# Patient Record
Sex: Female | Born: 1950
Health system: Southern US, Community
[De-identification: ages and names within clinical notes are randomized; demographics above are authoritative.]

## PROBLEM LIST (undated history)

## (undated) DIAGNOSIS — J449 Chronic obstructive pulmonary disease, unspecified: Secondary | ICD-10-CM

## (undated) DIAGNOSIS — I10 Essential (primary) hypertension: Secondary | ICD-10-CM

## (undated) DIAGNOSIS — M1990 Unspecified osteoarthritis, unspecified site: Secondary | ICD-10-CM

## (undated) DIAGNOSIS — I82409 Acute embolism and thrombosis of unspecified deep veins of unspecified lower extremity: Secondary | ICD-10-CM

## (undated) DIAGNOSIS — N189 Chronic kidney disease, unspecified: Secondary | ICD-10-CM

## (undated) DIAGNOSIS — D649 Anemia, unspecified: Secondary | ICD-10-CM

## (undated) DIAGNOSIS — K5090 Crohn's disease, unspecified, without complications: Secondary | ICD-10-CM

---

## 2003-11-25 DIAGNOSIS — I82409 Acute embolism and thrombosis of unspecified deep veins of unspecified lower extremity: Secondary | ICD-10-CM

## 2003-11-25 HISTORY — DX: Acute embolism and thrombosis of unspecified deep veins of unspecified lower extremity: I82.409

## 2003-12-14 ENCOUNTER — Other Ambulatory Visit: Payer: Self-pay

## 2004-06-21 ENCOUNTER — Other Ambulatory Visit: Payer: Self-pay

## 2005-04-17 ENCOUNTER — Ambulatory Visit: Payer: Self-pay | Admitting: Internal Medicine

## 2006-08-13 ENCOUNTER — Ambulatory Visit: Payer: Self-pay | Admitting: Internal Medicine

## 2006-10-30 ENCOUNTER — Ambulatory Visit: Payer: Self-pay | Admitting: Unknown Physician Specialty

## 2006-12-24 ENCOUNTER — Ambulatory Visit: Payer: Self-pay | Admitting: Internal Medicine

## 2006-12-25 ENCOUNTER — Ambulatory Visit: Payer: Self-pay | Admitting: Internal Medicine

## 2007-01-01 ENCOUNTER — Ambulatory Visit: Payer: Self-pay | Admitting: Unknown Physician Specialty

## 2007-01-23 ENCOUNTER — Ambulatory Visit: Payer: Self-pay | Admitting: Internal Medicine

## 2007-01-29 ENCOUNTER — Ambulatory Visit: Payer: Self-pay | Admitting: Unknown Physician Specialty

## 2007-02-23 ENCOUNTER — Ambulatory Visit: Payer: Self-pay | Admitting: Internal Medicine

## 2007-10-12 ENCOUNTER — Ambulatory Visit: Payer: Self-pay | Admitting: Internal Medicine

## 2008-01-07 ENCOUNTER — Ambulatory Visit: Payer: Self-pay | Admitting: Specialist

## 2008-02-08 ENCOUNTER — Other Ambulatory Visit: Payer: Self-pay

## 2008-02-08 ENCOUNTER — Ambulatory Visit: Payer: Self-pay | Admitting: Specialist

## 2008-02-17 ENCOUNTER — Ambulatory Visit: Payer: Self-pay | Admitting: Specialist

## 2008-03-13 ENCOUNTER — Ambulatory Visit: Payer: Self-pay | Admitting: Specialist

## 2008-07-25 ENCOUNTER — Inpatient Hospital Stay: Payer: Self-pay | Admitting: Unknown Physician Specialty

## 2008-10-13 ENCOUNTER — Ambulatory Visit: Payer: Self-pay | Admitting: Internal Medicine

## 2008-11-09 ENCOUNTER — Ambulatory Visit: Payer: Self-pay

## 2008-12-19 ENCOUNTER — Ambulatory Visit: Payer: Self-pay | Admitting: Internal Medicine

## 2009-05-25 ENCOUNTER — Ambulatory Visit: Payer: Self-pay | Admitting: Obstetrics and Gynecology

## 2009-06-01 ENCOUNTER — Ambulatory Visit: Payer: Self-pay | Admitting: Obstetrics and Gynecology

## 2009-10-22 ENCOUNTER — Ambulatory Visit: Payer: Self-pay | Admitting: Internal Medicine

## 2010-10-23 ENCOUNTER — Ambulatory Visit: Payer: Self-pay | Admitting: Internal Medicine

## 2010-12-04 ENCOUNTER — Other Ambulatory Visit: Payer: Self-pay

## 2011-12-09 ENCOUNTER — Ambulatory Visit: Payer: Self-pay | Admitting: Internal Medicine

## 2011-12-19 ENCOUNTER — Ambulatory Visit: Payer: Self-pay | Admitting: Unknown Physician Specialty

## 2011-12-22 LAB — PATHOLOGY REPORT

## 2015-03-12 ENCOUNTER — Ambulatory Visit
Admit: 2015-03-12 | Disposition: A | Discharge status: Home / Self Care | Payer: Self-pay | Attending: Family Medicine | Admitting: Family Medicine

## 2015-03-21 ENCOUNTER — Other Ambulatory Visit: Payer: Self-pay | Admitting: Family Medicine

## 2015-03-21 DIAGNOSIS — Z1231 Encounter for screening mammogram for malignant neoplasm of breast: Secondary | ICD-10-CM

## 2015-05-30 ENCOUNTER — Encounter: Payer: Self-pay | Admitting: *Deleted

## 2015-05-31 ENCOUNTER — Ambulatory Visit
Admission: RE | Admit: 2015-05-31 | Discharge: 2015-05-31 | Disposition: A | Discharge status: Home / Self Care | Payer: Medicare Other | Source: Ambulatory Visit | Attending: Unknown Physician Specialty | Admitting: Unknown Physician Specialty

## 2015-05-31 ENCOUNTER — Encounter: Payer: Self-pay | Admitting: Anesthesiology

## 2015-05-31 ENCOUNTER — Encounter
Admission: RE | Disposition: A | Discharge status: Home / Self Care | Payer: Self-pay | Source: Ambulatory Visit | Attending: Unknown Physician Specialty

## 2015-05-31 ENCOUNTER — Ambulatory Visit: Payer: Medicare Other | Admitting: Anesthesiology

## 2015-05-31 DIAGNOSIS — N189 Chronic kidney disease, unspecified: Secondary | ICD-10-CM | POA: Diagnosis not present

## 2015-05-31 DIAGNOSIS — I252 Old myocardial infarction: Secondary | ICD-10-CM | POA: Diagnosis not present

## 2015-05-31 DIAGNOSIS — J449 Chronic obstructive pulmonary disease, unspecified: Secondary | ICD-10-CM | POA: Diagnosis not present

## 2015-05-31 DIAGNOSIS — K5000 Crohn's disease of small intestine without complications: Secondary | ICD-10-CM | POA: Insufficient documentation

## 2015-05-31 DIAGNOSIS — I82409 Acute embolism and thrombosis of unspecified deep veins of unspecified lower extremity: Secondary | ICD-10-CM | POA: Diagnosis not present

## 2015-05-31 DIAGNOSIS — Z79899 Other long term (current) drug therapy: Secondary | ICD-10-CM | POA: Diagnosis not present

## 2015-05-31 DIAGNOSIS — K529 Noninfective gastroenteritis and colitis, unspecified: Secondary | ICD-10-CM | POA: Diagnosis not present

## 2015-05-31 DIAGNOSIS — I129 Hypertensive chronic kidney disease with stage 1 through stage 4 chronic kidney disease, or unspecified chronic kidney disease: Secondary | ICD-10-CM | POA: Diagnosis not present

## 2015-05-31 HISTORY — PX: COLONOSCOPY: SHX5424

## 2015-05-31 HISTORY — DX: Essential (primary) hypertension: I10

## 2015-05-31 HISTORY — DX: Anemia, unspecified: D64.9

## 2015-05-31 HISTORY — DX: Acute embolism and thrombosis of unspecified deep veins of unspecified lower extremity: I82.409

## 2015-05-31 HISTORY — DX: Chronic kidney disease, unspecified: N18.9

## 2015-05-31 HISTORY — DX: Unspecified osteoarthritis, unspecified site: M19.90

## 2015-05-31 HISTORY — DX: Crohn's disease, unspecified, without complications: K50.90

## 2015-05-31 HISTORY — DX: Chronic obstructive pulmonary disease, unspecified: J44.9

## 2015-05-31 SURGERY — COLONOSCOPY
Anesthesia: General

## 2015-05-31 MED ORDER — PROPOFOL INFUSION 10 MG/ML OPTIME
INTRAVENOUS | Status: DC | PRN
  Administered 2015-05-31: 120 ug/kg/min via INTRAVENOUS

## 2015-05-31 MED ORDER — MIDAZOLAM HCL 5 MG/5ML IJ SOLN
INTRAMUSCULAR | Status: DC | PRN
Start: 2015-05-31 — End: 2015-05-31
  Administered 2015-05-31: 1 mg via INTRAVENOUS

## 2015-05-31 MED ORDER — FENTANYL CITRATE (PF) 100 MCG/2ML IJ SOLN
INTRAMUSCULAR | Status: DC | PRN
  Administered 2015-05-31: 50 ug via INTRAVENOUS

## 2015-05-31 MED ORDER — SODIUM CHLORIDE 0.9 % IV SOLN
INTRAVENOUS | Status: DC
  Administered 2015-05-31: 13:00:00 via INTRAVENOUS

## 2015-05-31 MED ORDER — PROPOFOL 10 MG/ML IV BOLUS
INTRAVENOUS | Status: DC | PRN
  Administered 2015-05-31: 30 mg via INTRAVENOUS

## 2015-05-31 MED ORDER — PHENYLEPHRINE HCL 10 MG/ML IJ SOLN
INTRAMUSCULAR | Status: DC | PRN
  Administered 2015-05-31 (×3): 100 ug via INTRAVENOUS

## 2015-05-31 MED ORDER — SODIUM CHLORIDE 0.9 % IV SOLN
INTRAVENOUS | Status: DC
Start: 2015-05-31 — End: 2015-05-31

## 2015-05-31 NOTE — H&P (Signed)
Primary Care Physician:  Loistine Chance, MD Primary Gastroenterologist:  Dr. Vira Agar  Pre-Procedure History & Physical: HPI:  Bridget Huber is a 64 y.o. female is here for an colonoscopy.   Past Medical History  Diagnosis Date  . Hypertension   . Crohn's disease   . Anemia   . DVT (deep venous thrombosis)   . Chronic kidney disease     kidney stones  . COPD (chronic obstructive pulmonary disease)   . Arthritis     History reviewed. No pertinent past surgical history.  Prior to Admission medications   Medication Sig Start Date End Date Taking? Authorizing Provider  acetaminophen (TYLENOL) 650 MG CR tablet Take 650 mg by mouth every 8 (eight) hours as needed for pain.   Yes Historical Provider, MD  azaTHIOprine (IMURAN) 50 MG tablet Take 50 mg by mouth daily.   Yes Historical Provider, MD  Calcium Carbonate-Vitamin D 600-400 MG-UNIT per tablet Take 1 tablet by mouth daily.   Yes Historical Provider, MD  Multiple Vitamins-Minerals (MULTIVITAMIN WITH MINERALS) tablet Take 1 tablet by mouth daily.   Yes Historical Provider, MD    Allergies as of 03/05/2015  . (Not on File)    History reviewed. No pertinent family history.  History   Social History  . Marital Status: Divorced    Spouse Name: N/A  . Number of Children: N/A  . Years of Education: N/A   Occupational History  . Not on file.   Social History Main Topics  . Smoking status: Not on file  . Smokeless tobacco: Not on file  . Alcohol Use: Not on file  . Drug Use: Not on file  . Sexual Activity: Not on file   Other Topics Concern  . Not on file   Social History Narrative    Review of Systems: See HPI, otherwise negative ROS  Physical Exam: BP 103/77 mmHg  Pulse 94  Temp(Src) 98.8 F (37.1 C) (Tympanic)  Resp 20  Ht 5\' 2"  (1.575 m)  Wt 53.524 kg (118 lb)  BMI 21.58 kg/m2  SpO2 100% General:   Alert,  pleasant and cooperative in NAD Head:  Normocephalic and atraumatic. Neck:  Supple; no  masses or thyromegaly. Lungs:  Clear throughout to auscultation.    Heart:  Regular rate and rhythm. Abdomen:  Soft, nontender and nondistended. Normal bowel sounds, without guarding, and without rebound.   Neurologic:  Alert and  oriented x4;  grossly normal neurologically.  Impression/Plan: [name] is here for an colonoscopy to be performed for Crohn's disease  Risks, benefits, limitations, and alternatives regarding  colonoscopy have been reviewed with the patient.  Questions have been answered.  All parties agreeable.   Gaylyn Cheers, MD  05/31/2015, 1:27 PM   Primary Care Physician:  Loistine Chance, MD Primary Gastroenterologist:  Dr. Vira Agar  Pre-Procedure History & Physical: HPI:  Bridget Huber is a 64 y.o. female is here for an colonoscopy.   Past Medical History  Diagnosis Date  . Hypertension   . Crohn's disease   . Anemia   . DVT (deep venous thrombosis)   . Chronic kidney disease     kidney stones  . COPD (chronic obstructive pulmonary disease)   . Arthritis     History reviewed. No pertinent past surgical history.  Prior to Admission medications   Medication Sig Start Date End Date Taking? Authorizing Provider  acetaminophen (TYLENOL) 650 MG CR tablet Take 650 mg by mouth every 8 (eight) hours  as needed for pain.   Yes Historical Provider, MD  azaTHIOprine (IMURAN) 50 MG tablet Take 50 mg by mouth daily.   Yes Historical Provider, MD  Calcium Carbonate-Vitamin D 600-400 MG-UNIT per tablet Take 1 tablet by mouth daily.   Yes Historical Provider, MD  Multiple Vitamins-Minerals (MULTIVITAMIN WITH MINERALS) tablet Take 1 tablet by mouth daily.   Yes Historical Provider, MD    Allergies as of 03/05/2015  . (Not on File)    History reviewed. No pertinent family history.  History   Social History  . Marital Status: Divorced    Spouse Name: N/A  . Number of Children: N/A  . Years of Education: N/A   Occupational History  . Not on file.    Social History Main Topics  . Smoking status: Not on file  . Smokeless tobacco: Not on file  . Alcohol Use: Not on file  . Drug Use: Not on file  . Sexual Activity: Not on file   Other Topics Concern  . Not on file   Social History Narrative    Review of Systems: See HPI, otherwise negative ROS  Physical Exam: BP 103/77 mmHg  Pulse 94  Temp(Src) 98.8 F (37.1 C) (Tympanic)  Resp 20  Ht 5\' 2"  (1.575 m)  Wt 53.524 kg (118 lb)  BMI 21.58 kg/m2  SpO2 100% General:   Alert,  pleasant and cooperative in NAD Head:  Normocephalic and atraumatic. Neck:  Supple; no masses or thyromegaly. Lungs:  Clear throughout to auscultation.    Heart:  Regular rate and rhythm. Abdomen:  Soft, nontender and nondistended. Normal bowel sounds, without guarding, and without rebound.   Neurologic:  Alert and  oriented x4;  grossly normal neurologically.  Impression/Plan: Bridget Huber is here for an colonoscopy to be performed for Crohn's disease  Risks, benefits, limitations, and alternatives regarding  colonoscopy have been reviewed with the patient.  Questions have been answered.  All parties agreeable.   Gaylyn Cheers, MD  05/31/2015, 1:27 PM

## 2015-05-31 NOTE — Op Note (Signed)
[facility] Patient Name: Bridget Huber Procedure Date: 05/31/2015 1:35 PM MRN: [id number] Account #: [id number] Date of Birth: 1951/10/27 Admit Type: Outpatient Age: 64 Room: [facility] ENDO ROOM 1 Gender: Female Note Status: Finalized Procedure:         Colonoscopy Indications:       Follow-up of Crohn's disease of the small bowel and colon Providers:         Manya Silvas, MD Referring MD:      Tania Ade (Referring MD) Medicines:         Propofol per Anesthesia Complications:     No immediate complications. Procedure:         Pre-Anesthesia Assessment:                    - After reviewing the risks and benefits, the patient was                     deemed in satisfactory condition to undergo the procedure.                    After obtaining informed consent, the colonoscope was                     passed under direct vision. Throughout the procedure, the                     patient's blood pressure, pulse, and oxygen saturations                     were monitored continuously. The Colonoscope was                     introduced through the anus and advanced to the the                     terminal ileum. The colonoscopy was performed without                     difficulty. The patient tolerated the procedure well. The                     quality of the bowel preparation was excellent. Findings:      The colon (entire examined portion) appeared normal.      A diffuse and patchy area of mucosa in the distal ileum was moderately       erythematous, granular and ulcerated. Biopsies were taken with a cold       forceps for histology. Impression:        - The entire examined colon is normal.                    - Erythematous, granular and ulcerated mucosa in the                     distal ileum. Biopsied. Recommendation:    - Await pathology results. Discuss medication change. Manya Silvas, MD 05/31/2015 1:54:32 PM This report has been signed  electronically. Number of Addenda: 0 Note Initiated On: 05/31/2015 1:35 PM Scope Withdrawal Time: 0 hours 4 minutes 7 seconds  Total Procedure Duration: 0 hours 10 minutes 12 seconds       [facility]

## 2015-05-31 NOTE — Transfer of Care (Signed)
Immediate Anesthesia Transfer of Care Note  Patient: Bridget Huber  Procedure(s) Performed: Procedure(s): COLONOSCOPY (N/A)  Patient Location: PACU  Anesthesia Type:General  Level of Consciousness: awake, alert , oriented and patient cooperative  Airway & Oxygen Therapy: Patient Spontanous Breathing and Patient connected to nasal cannula oxygen  Post-op Assessment: Report given to RN and Post -op Vital signs reviewed and stable  Post vital signs: Reviewed and stable  Last Vitals:  Filed Vitals:   05/31/15 1358  BP: 83/50  Pulse:   Temp: 36 C  Resp: 16    Complications: No apparent anesthesia complications

## 2015-05-31 NOTE — Anesthesia Postprocedure Evaluation (Signed)
  Anesthesia Post-op Note  Patient: Bridget Huber  Procedure(s) Performed: Procedure(s): COLONOSCOPY (N/A)  Anesthesia type:General  Patient location: PACU  Post pain: Pain level controlled  Post assessment: Post-op Vital signs reviewed, Patient's Cardiovascular Status Stable, Respiratory Function Stable, Patent Airway and No signs of Nausea or vomiting  Post vital signs: Reviewed and stable  Last Vitals:  Filed Vitals:   05/31/15 1415  BP: 101/59  Pulse: 75  Temp:   Resp: 19    Level of consciousness: awake, alert  and patient cooperative  Complications: No apparent anesthesia complications

## 2015-05-31 NOTE — Anesthesia Preprocedure Evaluation (Signed)
Anesthesia Evaluation  Patient identified by MRN, date of birth, ID band Patient awake    Reviewed: Allergy & Precautions, H&P , NPO status , Patient's Chart, lab work & pertinent test results, reviewed documented beta blocker date and time   Airway Mallampati: II  TM Distance: >3 FB Neck ROM: full    Dental  (+) Lower Dentures, Upper Dentures   Pulmonary shortness of breath, COPD breath sounds clear to auscultation  Pulmonary exam normal       Cardiovascular hypertension, - Past MI negative cardio ROS Normal cardiovascular examRhythm:regular Rate:Normal     Neuro/Psych negative neurological ROS  negative psych ROS   GI/Hepatic negative GI ROS, Neg liver ROS,   Endo/Other  negative endocrine ROS  Renal/GU CRFRenal disease  negative genitourinary   Musculoskeletal   Abdominal   Peds  Hematology negative hematology ROS (+)   Anesthesia Other Findings Past Medical History:   Hypertension                                                 Crohn's disease                                              Anemia                                                       DVT (deep venous thrombosis)                                 Chronic kidney disease                                         Comment:kidney stones   COPD (chronic obstructive pulmonary disease)                 Arthritis                                                    Reproductive/Obstetrics negative OB ROS                             Anesthesia Physical Anesthesia Plan  ASA: III  Anesthesia Plan: General   Post-op Pain Management:    Induction:   Airway Management Planned:   Additional Equipment:   Intra-op Plan:   Post-operative Plan:   Informed Consent: I have reviewed the patients History and Physical, chart, labs and discussed the procedure including the risks, benefits and alternatives for the proposed anesthesia with  the patient or authorized representative who has indicated his/her understanding and acceptance.   Dental Advisory Given  Plan Discussed with: Anesthesiologist, CRNA and Surgeon  Anesthesia Plan Comments:  Anesthesia Quick Evaluation  

## 2015-06-01 ENCOUNTER — Encounter: Payer: Self-pay | Admitting: Unknown Physician Specialty

## 2015-06-04 LAB — SURGICAL PATHOLOGY

## 2017-08-31 ENCOUNTER — Other Ambulatory Visit: Payer: Self-pay | Admitting: Unknown Physician Specialty

## 2017-08-31 ENCOUNTER — Other Ambulatory Visit: Payer: Self-pay | Admitting: Family Medicine

## 2017-08-31 DIAGNOSIS — Z1231 Encounter for screening mammogram for malignant neoplasm of breast: Secondary | ICD-10-CM

## 2017-09-17 ENCOUNTER — Ambulatory Visit
Admission: RE | Admit: 2017-09-17 | Discharge: 2017-09-17 | Disposition: A | Discharge status: Home / Self Care | Payer: No Typology Code available for payment source | Source: Ambulatory Visit | Attending: Family Medicine | Admitting: Family Medicine

## 2017-09-17 ENCOUNTER — Encounter: Payer: Self-pay | Admitting: Radiology

## 2017-09-17 DIAGNOSIS — Z1231 Encounter for screening mammogram for malignant neoplasm of breast: Secondary | ICD-10-CM | POA: Insufficient documentation

## 2019-09-29 ENCOUNTER — Other Ambulatory Visit: Payer: Self-pay | Admitting: Family Medicine

## 2019-09-29 DIAGNOSIS — Z1231 Encounter for screening mammogram for malignant neoplasm of breast: Secondary | ICD-10-CM

## 2019-12-27 ENCOUNTER — Ambulatory Visit
Admission: RE | Admit: 2019-12-27 | Discharge: 2019-12-27 | Disposition: A | Discharge status: Home / Self Care | Payer: No Typology Code available for payment source | Source: Ambulatory Visit | Attending: Family Medicine | Admitting: Family Medicine

## 2019-12-27 DIAGNOSIS — Z1231 Encounter for screening mammogram for malignant neoplasm of breast: Secondary | ICD-10-CM | POA: Diagnosis present

## 2020-01-07 ENCOUNTER — Ambulatory Visit: Payer: PRIVATE HEALTH INSURANCE | Attending: Internal Medicine

## 2020-01-07 DIAGNOSIS — Z23 Encounter for immunization: Secondary | ICD-10-CM | POA: Insufficient documentation

## 2020-01-07 NOTE — Progress Notes (Signed)
   Covid-19 Vaccination Clinic  Name:  Bridget Huber    MRN: [id number] DOB: 01/23/1951  01/07/2020  [name] was observed post Covid-19 immunization for 15 minutes without incidence. She was provided with Vaccine Information Sheet and instruction to access the V-Safe system.   [name] was instructed to call 911 with any severe reactions post vaccine: Marland Kitchen Difficulty breathing  . Swelling of your face and throat  . A fast heartbeat  . A bad rash all over your body  . Dizziness and weakness    Immunizations Administered    Name Date Dose VIS Date Route   Pfizer COVID-19 Vaccine 01/07/2020  9:47 AM 0.3 mL 11/04/2019 Intramuscular   Manufacturer: [place]   Lot: [id number]   [place]: [id number]

## 2020-01-28 ENCOUNTER — Ambulatory Visit: Payer: PRIVATE HEALTH INSURANCE | Attending: Internal Medicine

## 2020-01-28 ENCOUNTER — Other Ambulatory Visit: Payer: Self-pay

## 2020-01-28 DIAGNOSIS — Z23 Encounter for immunization: Secondary | ICD-10-CM | POA: Insufficient documentation

## 2020-01-28 NOTE — Progress Notes (Signed)
   Covid-19 Vaccination Clinic  Name:  JA CARPENTIER    MRN: [id number] DOB: 1951/05/10  01/28/2020  Ms. Anschutz was observed post Covid-19 immunization for 15 minutes without incident. She was provided with Vaccine Information Sheet and instruction to access the V-Safe system.   Ms. Durrer was instructed to call 911 with any severe reactions post vaccine: Marland Kitchen Difficulty breathing  . Swelling of face and throat  . A fast heartbeat  . A bad rash all over body  . Dizziness and weakness   Immunizations Administered    Name Date Dose VIS Date Route   Pfizer COVID-19 Vaccine 01/28/2020  9:18 AM 0.3 mL 11/04/2019 Intramuscular   Manufacturer: [place]   Lot: [id number]   [place]: [id number]

## 2020-09-04 DIAGNOSIS — Z23 Encounter for immunization: Secondary | ICD-10-CM | POA: Diagnosis not present

## 2020-11-27 DIAGNOSIS — N183 Chronic kidney disease, stage 3 unspecified: Secondary | ICD-10-CM | POA: Diagnosis present

## 2020-11-27 DIAGNOSIS — N1831 Chronic kidney disease, stage 3a: Secondary | ICD-10-CM | POA: Diagnosis not present

## 2020-11-27 DIAGNOSIS — K5010 Crohn's disease of large intestine without complications: Secondary | ICD-10-CM | POA: Diagnosis not present

## 2020-11-27 DIAGNOSIS — Z0000 Encounter for general adult medical examination without abnormal findings: Secondary | ICD-10-CM | POA: Diagnosis not present

## 2020-11-29 DIAGNOSIS — K5000 Crohn's disease of small intestine without complications: Secondary | ICD-10-CM | POA: Diagnosis not present

## 2021-01-01 DIAGNOSIS — M8588 Other specified disorders of bone density and structure, other site: Secondary | ICD-10-CM | POA: Diagnosis not present

## 2021-02-28 DIAGNOSIS — Z79899 Other long term (current) drug therapy: Secondary | ICD-10-CM | POA: Diagnosis not present

## 2021-02-28 DIAGNOSIS — K5000 Crohn's disease of small intestine without complications: Secondary | ICD-10-CM | POA: Diagnosis not present

## 2021-03-06 DIAGNOSIS — K5000 Crohn's disease of small intestine without complications: Secondary | ICD-10-CM | POA: Diagnosis not present

## 2021-03-19 ENCOUNTER — Other Ambulatory Visit: Payer: Self-pay

## 2021-03-19 ENCOUNTER — Emergency Department: Payer: PPO

## 2021-03-19 ENCOUNTER — Inpatient Hospital Stay
Admission: EM | Admit: 2021-03-19 | Discharge: 2021-03-22 | DRG: 386 | Disposition: A | Discharge status: Home / Self Care | Payer: PPO | Attending: Internal Medicine | Admitting: Internal Medicine

## 2021-03-19 DIAGNOSIS — E876 Hypokalemia: Secondary | ICD-10-CM | POA: Diagnosis present

## 2021-03-19 DIAGNOSIS — K6389 Other specified diseases of intestine: Secondary | ICD-10-CM | POA: Diagnosis not present

## 2021-03-19 DIAGNOSIS — K644 Residual hemorrhoidal skin tags: Secondary | ICD-10-CM | POA: Diagnosis not present

## 2021-03-19 DIAGNOSIS — N179 Acute kidney failure, unspecified: Secondary | ICD-10-CM | POA: Diagnosis present

## 2021-03-19 DIAGNOSIS — Z8719 Personal history of other diseases of the digestive system: Secondary | ICD-10-CM

## 2021-03-19 DIAGNOSIS — E860 Dehydration: Secondary | ICD-10-CM | POA: Diagnosis not present

## 2021-03-19 DIAGNOSIS — N1831 Chronic kidney disease, stage 3a: Secondary | ICD-10-CM | POA: Diagnosis present

## 2021-03-19 DIAGNOSIS — Z87442 Personal history of urinary calculi: Secondary | ICD-10-CM | POA: Diagnosis not present

## 2021-03-19 DIAGNOSIS — Z1211 Encounter for screening for malignant neoplasm of colon: Secondary | ICD-10-CM | POA: Diagnosis not present

## 2021-03-19 DIAGNOSIS — F17200 Nicotine dependence, unspecified, uncomplicated: Secondary | ICD-10-CM | POA: Diagnosis present

## 2021-03-19 DIAGNOSIS — Z720 Tobacco use: Secondary | ICD-10-CM | POA: Diagnosis present

## 2021-03-19 DIAGNOSIS — I129 Hypertensive chronic kidney disease with stage 1 through stage 4 chronic kidney disease, or unspecified chronic kidney disease: Secondary | ICD-10-CM | POA: Diagnosis present

## 2021-03-19 DIAGNOSIS — R112 Nausea with vomiting, unspecified: Secondary | ICD-10-CM | POA: Diagnosis not present

## 2021-03-19 DIAGNOSIS — Z20822 Contact with and (suspected) exposure to covid-19: Secondary | ICD-10-CM | POA: Diagnosis present

## 2021-03-19 DIAGNOSIS — R1084 Generalized abdominal pain: Secondary | ICD-10-CM

## 2021-03-19 DIAGNOSIS — K838 Other specified diseases of biliary tract: Secondary | ICD-10-CM | POA: Diagnosis not present

## 2021-03-19 DIAGNOSIS — K633 Ulcer of intestine: Secondary | ICD-10-CM | POA: Diagnosis not present

## 2021-03-19 DIAGNOSIS — J449 Chronic obstructive pulmonary disease, unspecified: Secondary | ICD-10-CM | POA: Diagnosis not present

## 2021-03-19 DIAGNOSIS — D509 Iron deficiency anemia, unspecified: Secondary | ICD-10-CM | POA: Diagnosis not present

## 2021-03-19 DIAGNOSIS — K3189 Other diseases of stomach and duodenum: Secondary | ICD-10-CM | POA: Diagnosis not present

## 2021-03-19 DIAGNOSIS — K5090 Crohn's disease, unspecified, without complications: Secondary | ICD-10-CM | POA: Diagnosis not present

## 2021-03-19 DIAGNOSIS — I10 Essential (primary) hypertension: Secondary | ICD-10-CM | POA: Diagnosis present

## 2021-03-19 DIAGNOSIS — Z8249 Family history of ischemic heart disease and other diseases of the circulatory system: Secondary | ICD-10-CM

## 2021-03-19 DIAGNOSIS — K5939 Other megacolon: Secondary | ICD-10-CM | POA: Diagnosis not present

## 2021-03-19 DIAGNOSIS — K5000 Crohn's disease of small intestine without complications: Secondary | ICD-10-CM | POA: Diagnosis not present

## 2021-03-19 DIAGNOSIS — K449 Diaphragmatic hernia without obstruction or gangrene: Secondary | ICD-10-CM | POA: Diagnosis not present

## 2021-03-19 DIAGNOSIS — K50912 Crohn's disease, unspecified, with intestinal obstruction: Secondary | ICD-10-CM | POA: Diagnosis present

## 2021-03-19 DIAGNOSIS — Z79899 Other long term (current) drug therapy: Secondary | ICD-10-CM

## 2021-03-19 DIAGNOSIS — E872 Acidosis: Secondary | ICD-10-CM | POA: Diagnosis not present

## 2021-03-19 DIAGNOSIS — Z9049 Acquired absence of other specified parts of digestive tract: Secondary | ICD-10-CM

## 2021-03-19 DIAGNOSIS — R1032 Left lower quadrant pain: Secondary | ICD-10-CM | POA: Diagnosis present

## 2021-03-19 DIAGNOSIS — Z886 Allergy status to analgesic agent status: Secondary | ICD-10-CM

## 2021-03-19 DIAGNOSIS — K50012 Crohn's disease of small intestine with intestinal obstruction: Principal | ICD-10-CM | POA: Diagnosis present

## 2021-03-19 DIAGNOSIS — R109 Unspecified abdominal pain: Secondary | ICD-10-CM | POA: Insufficient documentation

## 2021-03-19 LAB — COMPREHENSIVE METABOLIC PANEL
ALT: 12 U/L (ref 0–44)
AST: 15 U/L (ref 15–41)
Albumin: 3.8 g/dL (ref 3.5–5.0)
Alkaline Phosphatase: 83 U/L (ref 38–126)
Anion gap: 11 (ref 5–15)
BUN: 22 mg/dL (ref 8–23)
CO2: 17 mmol/L — ABNORMAL LOW (ref 22–32)
Calcium: 8.9 mg/dL (ref 8.9–10.3)
Chloride: 108 mmol/L (ref 98–111)
Creatinine, Ser: 1.62 mg/dL — ABNORMAL HIGH (ref 0.44–1.00)
GFR, Estimated: 34 mL/min — ABNORMAL LOW (ref >60)
Glucose, Bld: 117 mg/dL — ABNORMAL HIGH (ref 70–99)
Potassium: 3 mmol/L — ABNORMAL LOW (ref 3.5–5.1)
Sodium: 136 mmol/L (ref 135–145)
Total Bilirubin: 0.8 mg/dL (ref 0.3–1.2)
Total Protein: 7 g/dL (ref 6.5–8.1)

## 2021-03-19 LAB — URINALYSIS, COMPLETE (UACMP) WITH MICROSCOPIC
Bacteria, UA: NONE SEEN
Bilirubin Urine: NEGATIVE
Glucose, UA: NEGATIVE mg/dL
Hgb urine dipstick: NEGATIVE
Ketones, ur: 5 mg/dL — AB
Nitrite: NEGATIVE
Protein, ur: 30 mg/dL — AB
Specific Gravity, Urine: >1.046 — ABNORMAL HIGH (ref 1.005–1.030)
pH: 5 (ref 5.0–8.0)

## 2021-03-19 LAB — RESP PANEL BY RT-PCR (FLU A&B, COVID) ARPGX2
Influenza A by PCR: NEGATIVE
Influenza B by PCR: NEGATIVE
SARS Coronavirus 2 by RT PCR: NEGATIVE

## 2021-03-19 LAB — APTT: aPTT: 32 seconds (ref 24–36)

## 2021-03-19 LAB — CBC
HCT: 37.8 % (ref 36.0–46.0)
Hemoglobin: 13 g/dL (ref 12.0–15.0)
MCH: 32.9 pg (ref 26.0–34.0)
MCHC: 34.4 g/dL (ref 30.0–36.0)
MCV: 95.7 fL (ref 80.0–100.0)
Platelets: 305 10*3/uL (ref 150–400)
RBC: 3.95 MIL/uL (ref 3.87–5.11)
RDW: 14.2 % (ref 11.5–15.5)
WBC: 5.7 10*3/uL (ref 4.0–10.5)
nRBC: 0.5 % — ABNORMAL HIGH (ref 0.0–0.2)

## 2021-03-19 LAB — MAGNESIUM: Magnesium: 1.5 mg/dL — ABNORMAL LOW (ref 1.7–2.4)

## 2021-03-19 LAB — PROTIME-INR
INR: 1.1 (ref 0.8–1.2)
Prothrombin Time: 14.1 seconds (ref 11.4–15.2)

## 2021-03-19 LAB — LIPASE, BLOOD: Lipase: 26 U/L (ref 11–51)

## 2021-03-19 MED ORDER — POTASSIUM CHLORIDE CRYS ER 20 MEQ PO TBCR
40.0000 meq | EXTENDED_RELEASE_TABLET | Freq: Once | ORAL | Status: AC
  Administered 2021-03-19: 40 meq via ORAL
  Filled 2021-03-19: qty 2

## 2021-03-19 MED ORDER — VITAMIN B-12 1000 MCG PO TABS
1000.0000 ug | ORAL_TABLET | Freq: Every day | ORAL | Status: DC
  Administered 2021-03-20 – 2021-03-22 (×2): 1000 ug via ORAL
  Filled 2021-03-19 (×2): qty 1

## 2021-03-19 MED ORDER — NICOTINE 21 MG/24HR TD PT24
21.0000 mg | MEDICATED_PATCH | Freq: Every day | TRANSDERMAL | Status: DC
  Administered 2021-03-21: 21 mg via TRANSDERMAL
  Filled 2021-03-19 (×2): qty 1

## 2021-03-19 MED ORDER — SODIUM CHLORIDE 0.9 % IV BOLUS
1000.0000 mL | Freq: Once | INTRAVENOUS | Status: AC
  Administered 2021-03-19: 1000 mL via INTRAVENOUS

## 2021-03-19 MED ORDER — DM-GUAIFENESIN ER 30-600 MG PO TB12: 1.0000 | ORAL_TABLET | Freq: Two times a day (BID) | ORAL | Status: DC | PRN

## 2021-03-19 MED ORDER — ALBUTEROL SULFATE HFA 108 (90 BASE) MCG/ACT IN AERS
2.0000 | INHALATION_SPRAY | RESPIRATORY_TRACT | Status: DC | PRN
  Filled 2021-03-19: qty 6.7

## 2021-03-19 MED ORDER — FERROUS SULFATE 325 (65 FE) MG PO TABS
325.0000 mg | ORAL_TABLET | Freq: Every day | ORAL | Status: DC
  Administered 2021-03-20: 325 mg via ORAL
  Filled 2021-03-19 (×2): qty 1

## 2021-03-19 MED ORDER — MORPHINE SULFATE (PF) 2 MG/ML IV SOLN
2.0000 mg | INTRAVENOUS | Status: DC | PRN
Start: 2021-03-19 — End: 2021-03-22
  Administered 2021-03-19: 2 mg via INTRAVENOUS
  Filled 2021-03-19: qty 1

## 2021-03-19 MED ORDER — PANTOPRAZOLE SODIUM 40 MG PO TBEC
40.0000 mg | DELAYED_RELEASE_TABLET | Freq: Every day | ORAL | Status: DC
  Administered 2021-03-19 – 2021-03-22 (×4): 40 mg via ORAL
  Filled 2021-03-19 (×4): qty 1

## 2021-03-19 MED ORDER — POTASSIUM CHLORIDE 10 MEQ/100ML IV SOLN
10.0000 meq | INTRAVENOUS | Status: AC
  Administered 2021-03-19 (×2): 10 meq via INTRAVENOUS
  Filled 2021-03-19 (×2): qty 100

## 2021-03-19 MED ORDER — ONDANSETRON HCL 4 MG/2ML IJ SOLN
4.0000 mg | Freq: Once | INTRAMUSCULAR | Status: AC
  Administered 2021-03-19: 4 mg via INTRAVENOUS
  Filled 2021-03-19: qty 2

## 2021-03-19 MED ORDER — VITAMIN D 25 MCG (1000 UNIT) PO TABS
2000.0000 [IU] | ORAL_TABLET | Freq: Every day | ORAL | Status: DC
  Administered 2021-03-20 – 2021-03-22 (×2): 2000 [IU] via ORAL
  Filled 2021-03-19 (×2): qty 2

## 2021-03-19 MED ORDER — SODIUM CHLORIDE 0.9 % IV SOLN: INTRAVENOUS | Status: DC

## 2021-03-19 MED ORDER — SODIUM CHLORIDE 0.9 % IV SOLN: Freq: Once | INTRAVENOUS | Status: AC

## 2021-03-19 MED ORDER — ONDANSETRON HCL 4 MG/2ML IJ SOLN: 4.0000 mg | Freq: Three times a day (TID) | INTRAMUSCULAR | Status: DC | PRN

## 2021-03-19 MED ORDER — ACETAMINOPHEN 325 MG PO TABS: 650.0000 mg | ORAL_TABLET | Freq: Four times a day (QID) | ORAL | Status: DC | PRN

## 2021-03-19 MED ORDER — ADULT MULTIVITAMIN W/MINERALS CH
1.0000 | ORAL_TABLET | Freq: Every day | ORAL | Status: DC
  Administered 2021-03-20 – 2021-03-22 (×2): 1 via ORAL
  Filled 2021-03-19 (×2): qty 1

## 2021-03-19 MED ORDER — AZATHIOPRINE 50 MG PO TABS
100.0000 mg | ORAL_TABLET | Freq: Every day | ORAL | Status: DC
  Administered 2021-03-20 – 2021-03-22 (×2): 100 mg via ORAL
  Filled 2021-03-19 (×3): qty 2

## 2021-03-19 MED ORDER — HEPARIN SODIUM (PORCINE) 5000 UNIT/ML IJ SOLN
5000.0000 [IU] | Freq: Three times a day (TID) | INTRAMUSCULAR | Status: DC
  Administered 2021-03-19 – 2021-03-22 (×8): 5000 [IU] via SUBCUTANEOUS
  Filled 2021-03-19 (×8): qty 1

## 2021-03-19 MED ORDER — IOHEXOL 300 MG/ML  SOLN
75.0000 mL | Freq: Once | INTRAMUSCULAR | Status: AC | PRN
  Administered 2021-03-19: 75 mL via INTRAVENOUS

## 2021-03-19 MED ORDER — HYDRALAZINE HCL 20 MG/ML IJ SOLN: 5.0000 mg | INTRAMUSCULAR | Status: DC | PRN

## 2021-03-19 MED ORDER — METHYLPREDNISOLONE SODIUM SUCC 125 MG IJ SOLR
60.0000 mg | Freq: Once | INTRAMUSCULAR | Status: AC
  Administered 2021-03-19: 60 mg via INTRAVENOUS
  Filled 2021-03-19: qty 2

## 2021-03-19 MED ORDER — CALCIUM CARBONATE-VITAMIN D 500-200 MG-UNIT PO TABS
1.0000 | ORAL_TABLET | Freq: Two times a day (BID) | ORAL | Status: DC
  Administered 2021-03-19 – 2021-03-22 (×5): 1 via ORAL
  Filled 2021-03-19 (×5): qty 1

## 2021-03-19 MED ORDER — MORPHINE SULFATE (PF) 4 MG/ML IV SOLN
4.0000 mg | INTRAVENOUS | Status: DC | PRN
  Administered 2021-03-19: 4 mg via INTRAVENOUS
  Filled 2021-03-19: qty 1

## 2021-03-19 MED ORDER — ACETAMINOPHEN 650 MG RE SUPP: 650.0000 mg | Freq: Four times a day (QID) | RECTAL | Status: DC | PRN

## 2021-03-19 MED ORDER — METHYLPREDNISOLONE SODIUM SUCC 125 MG IJ SOLR
60.0000 mg | Freq: Every day | INTRAMUSCULAR | Status: DC
  Administered 2021-03-20 – 2021-03-21 (×2): 60 mg via INTRAVENOUS
  Filled 2021-03-19 (×2): qty 2

## 2021-03-19 NOTE — ED Notes (Signed)
Reminded patient of the need for a urine sample. Patient states she does not have to go right now. Will assess again later.

## 2021-03-19 NOTE — ED Triage Notes (Addendum)
Pt comes with c/o Crohn's crisis that started last week. Pt states pain to back, belly and just all over. Pt states N/V/D. Pt states large weight loss in last few weeks.

## 2021-03-19 NOTE — Consult Note (Signed)
[name], MD [address]  [place]  [place], [place] [address]  Main: [phone]  Fax: [phone] Pager: [phone]   Consultation  Referring Provider:     No ref. provider found Primary Care Physician:  Kirk Ruths, MD Primary Gastroenterologist:  Dr. Alice Reichert        Reason for Consultation: Flareup of Crohn's, small bowel obstruction  Date of Admission:  03/19/2021 Date of Consultation:  03/19/2021         HPI:   Bridget Huber is a 70 y.o. female with known history of small bowel Crohn's, s/p ileal resection with ileal colonic anastomosis, currently maintained on Imuran presented with right lower quadrant pain associated with nausea, vomiting. She was recently seen by [facility] on 02/28/2021, recommended to undergo colonoscopy to assess severity of disease and modification of her medication.  Patient sees Lyla Glassing, [place] at [facility], was previously managed by Dr. Vira Agar.  She has a chronic tobacco use.  Patient had normal ESR, CRP, mildly elevated fecal calprotectin levels 121, normal ferritin, B12 and folate levels, normal vitamin D levels  INFLAMMATORY BOWEL DISEASE HISTORY:  Type Crohns- fistulizing  Age/Date of Diagnosis: 1986 Disease Extent- small intestine/fistulizing Surgeries: small bowel resection 1984 (resection of TI, small piece of cecum, and appendix) with tempoary colostomy (ileocolonic) subsequent reversal (2001) and cholecystectomy (1950 Complications: enterocolomic fistula repaired as above, none other Extra-intestinal Manifestations: none Last Hospitalization associated with IBD: some years ago with her last flare  TREATMENT HISTORY:  Current DT:[id number] mg daily. Prior tx: Oral 5-ASAs - Pentasa. Topical 5-ASAs - no. Oral steroids - prednisone last 2009 Budesonide- 2005  Thiopurines -azathioprine. Biologics - Cimzia  2009-2014/15 (insurance issues). Humira (2008-2009- lasck of response)  ENDOSCOPIC HISTORY: Colonoscopy 05/31/15- RTE- distal ileum inflammation, otherwise normal looking colon. There was some chronic ileitis without dysplasia/malignancy.5y repeat recommended Colonoscopy1/25/13- RTE- transverse/descending/sigmoid colon/rectal inflammation- biopsies with chronic active inflammation/mild, some ulcerations. EGD 12/19/11- RTE= normal Colonoscopy 07/28/08- RTE- pancolitis, worse on left, moderate to marked inflammation EGD 07/26/08- RTE- normal Colonoscopy 01/01/07- RTE- left sided colitis with active inflammation on biopsies EGD 01/01/07- RTE- normal exam EGD 05/29/04- RTE- normal appearing Colonoscopy 02/26/04- RTE- inflammation in sigmoid colon, inflamed/ulcerated neo-TI. There was active enteritis and colitis of anastomotic and left colon biopsies. Colonoscopy 04/19/99- RTE- inflammatory sigmoid polyp, diverticulitis. Colonoscopy 06/26/1992- RTE- strictured neo-TI otherwise normal appearing Flex sig 06/25/1988- RTE- normal appearing   On presentation to the ED patient was hemodynamically stable, labs reviewed normal CBC, mild hypokalemia, normal lipase.  She underwent CT abdomen pelvis with contrast which revealed suspected acute on chronic Crohn's disease with stricture and potential fistula associated with bowel obstruction of the neoterminal ileum although proximal colon is decompressed.  Patient is receiving pain medication and felt better when I saw her.  Her last bowel movement was early this morning.  She is asking if she can have something to eat.  There is no evidence of intra-abdominal abscess or fluid collection  NSAIDs: None  Antiplts/Anticoagulants/Anti thrombotics: None  GI Procedures: As above  Past Medical History:  Diagnosis Date  . Anemia   . Arthritis   . Chronic kidney disease    kidney stones  . COPD (chronic obstructive pulmonary disease) ([place])   . Crohn's disease ([place])   . DVT  (deep venous thrombosis) ([place])   . Hypertension     Past Surgical History:  Procedure Laterality Date  . COLONOSCOPY N/A 05/31/2015   Procedure: COLONOSCOPY;  Surgeon: Manya Silvas, MD;  Location: [facility];  Service: Endoscopy;  Laterality: N/A;    Prior to Admission medications   Medication Sig Start Date End Date Taking? Authorizing Provider  acetaminophen (TYLENOL) 650 MG CR tablet Take 650 mg by mouth every 8 (eight) hours as needed for pain.    [provider]  azaTHIOprine (IMURAN) 50 MG tablet Take 50 mg by mouth daily.    [provider]  Calcium Carbonate-Vitamin D 600-400 MG-UNIT per tablet Take 1 tablet by mouth daily.    [provider]  Multiple Vitamins-Minerals (MULTIVITAMIN WITH MINERALS) tablet Take 1 tablet by mouth daily.    [provider]   Current Facility-Administered Medications:  .  0.9 %  sodium chloride infusion, , Intravenous, Continuous, Ivor Costa, MD, Last Rate: 75 mL/hr at 03/19/21 2238, New Bag at 03/19/21 2238 .  acetaminophen (TYLENOL) suppository 650 mg, 650 mg, Rectal, Q6H PRN, Ivor Costa, MD .  acetaminophen (TYLENOL) tablet 650 mg, 650 mg, Oral, Q6H PRN, Ivor Costa, MD .  albuterol (VENTOLIN HFA) 108 (90 Base) MCG/ACT inhaler 2 puff, 2 puff, Inhalation, Q4H PRN, Ivor Costa, MD .  Derrill Memo ON 03/20/2021] azaTHIOprine (IMURAN) tablet 100 mg, 100 mg, Oral, Daily, Ivor Costa, MD .  calcium-vitamin D (OSCAL WITH D) 500-200 MG-UNIT per tablet 1 tablet, 1 tablet, Oral, BID, Ivor Costa, MD, 1 tablet at 03/19/21 2050 .  [START ON 03/20/2021] cholecalciferol (VITAMIN D3) tablet 2,000 Units, 2,000 Units, Oral, Daily, Ivor Costa, MD .  dextromethorphan-guaiFENesin [facility] DM) 30-600 MG per 12 hr tablet 1 tablet, 1 tablet, Oral, BID PRN, Ivor Costa, MD .  Derrill Memo ON 03/20/2021] ferrous sulfate tablet 325 mg, 325 mg, Oral, Q breakfast, Ivor Costa, MD .  heparin injection 5,000 Units, 5,000 Units, Subcutaneous, Q8H, Ivor Costa,  MD, 5,000 Units at 03/19/21 2051 .  hydrALAZINE (APRESOLINE) injection 5 mg, 5 mg, Intravenous, Q2H PRN, Ivor Costa, MD .  Derrill Memo ON 03/20/2021] methylPREDNISolone sodium succinate (SOLU-MEDROL) 125 mg/2 mL injection 60 mg, 60 mg, Intravenous, Daily, Ivor Costa, MD .  morphine 2 MG/ML injection 2 mg, 2 mg, Intravenous, Q3H PRN, Ivor Costa, MD, 2 mg at 03/19/21 1734 .  [START ON 03/20/2021] multivitamin with minerals tablet 1 tablet, 1 tablet, Oral, Daily, Ivor Costa, MD .  nicotine (NICODERM CQ - dosed in mg/24 hours) patch 21 mg, 21 mg, Transdermal, Daily, Ivor Costa, MD .  ondansetron [facility]) injection 4 mg, 4 mg, Intravenous, Q8H PRN, Ivor Costa, MD .  pantoprazole (PROTONIX) EC tablet 40 mg, 40 mg, Oral, Daily, Ivor Costa, MD, 40 mg at 03/19/21 2050 .  [START ON 03/20/2021] vitamin B-12 (CYANOCOBALAMIN) tablet 1,000 mcg, 1,000 mcg, Oral, Daily, Ivor Costa, MD   Family History  Problem Relation Age of Onset  . Hypertension Brother   . Diabetes Mellitus II Brother      Social History   Tobacco Use  . Smoking status: Current Every Day Smoker  . Smokeless tobacco: Never Used  Substance Use Topics  . Alcohol use: Not Currently  . Drug use: Never    Allergies as of 03/19/2021 - Review Complete 03/19/2021  Allergen Reaction Noted  . Aspirin  05/30/2015    Review of Systems:    All systems reviewed and negative except where noted in HPI.   Physical Exam:  Vital signs in last 24 hours: Temp:  [98.8 F (37.1 C)-99 F (37.2 C)] 98.9 F (37.2 C) (04/26 1950) Pulse Rate:  [78-100] 80 (04/26 1950) Resp:  [  16-20] 20 (04/26 1950) BP: (118-138)/(60-109) 135/60 (04/26 1950) SpO2:  [98 %-100 %] 98 % (04/26 1950) Weight:  [63.5 kg-63.8 kg] 63.8 kg (04/26 1950)   General:   Pleasant, cooperative in NAD, thin built, moderately nourished Head:  Normocephalic and atraumatic. Eyes:   No icterus.   Conjunctiva pink. PERRLA. Ears:  Normal auditory acuity. Neck:  Supple; no masses or  thyroidomegaly Lungs: Respirations even and unlabored. Lungs clear to auscultation bilaterally.   No wheezes, crackles, or rhonchi.  Heart:  Regular rate and rhythm;  Without murmur, clicks, rubs or gallops Abdomen:  Soft, nondistended, right lower quadrant tenderness. Normal bowel sounds. No appreciable masses or hepatomegaly.  No rebound or guarding.  Rectal:  Not performed. Msk:  Symmetrical without gross deformities.  Strength generalized weakness Extremities:  Without edema, cyanosis or clubbing. Neurologic:  Alert and oriented x3;  grossly normal neurologically. Skin:  Intact without significant lesions or rashes. Psych:  Alert and cooperative. Normal affect.  LAB RESULTS: CBC Latest Ref Rng & Units 03/19/2021  WBC 4.0 - 10.5 K/uL 5.7  Hemoglobin 12.0 - 15.0 g/dL 13.0  Hematocrit 36.0 - 46.0 % 37.8  Platelets 150 - 400 K/uL 305    BMET BMP Latest Ref Rng & Units 03/19/2021  Glucose 70 - 99 mg/dL 117(H)  BUN 8 - 23 mg/dL 22  Creatinine 0.44 - 1.00 mg/dL 1.62(H)  Sodium 135 - 145 mmol/L 136  Potassium 3.5 - 5.1 mmol/L 3.0(L)  Chloride 98 - 111 mmol/L 108  CO2 22 - 32 mmol/L 17(L)  Calcium 8.9 - 10.3 mg/dL 8.9    LFT Hepatic Function Latest Ref Rng & Units 03/19/2021  Total Protein 6.5 - 8.1 g/dL 7.0  Albumin 3.5 - 5.0 g/dL 3.8  AST 15 - 41 U/L 15  ALT 0 - 44 U/L 12  Alk Phosphatase 38 - 126 U/L 83  Total Bilirubin 0.3 - 1.2 mg/dL 0.8     STUDIES: CT ABDOMEN PELVIS W CONTRAST  Result Date: 03/19/2021 CLINICAL DATA:  Bowel obstruction suspected, history of inflammatory bowel disease with abdominal pain EXAM: CT ABDOMEN AND PELVIS WITH CONTRAST TECHNIQUE: Multidetector CT imaging of the abdomen and pelvis was performed using the standard protocol following bolus administration of intravenous contrast. CONTRAST:  72mL OMNIPAQUE IOHEXOL 300 MG/ML  SOLN COMPARISON:  Prior studies from 2009. FINDINGS: Lower chest: Incidental imaging of the lung bases without effusion or  consolidation. Hepatobiliary: Post cholecystectomy with mild biliary duct distension this is increased since 2009 particularly with respect intrahepatic ducts with there is moderate intrahepatic biliary duct dilation of LEFT and RIGHT hepatic ducts. No focal, suspicious hepatic lesion. The portal vein is patent. Pancreas: Normal, without mass, inflammation or ductal dilatation. Spleen: Normal Adrenals/Urinary Tract: Under distended stomach limiting assessment. Question gastric thickening though similar appearance seen on previous imaging. Post ileocecal resection with long segment narrowing and mural stratification of the neoterminal ileum on image 41 of series 2 this measures approximately 3.6 cm greatest length with 4.2 cm upstream bowel dilation in the distal ileum. Signs of colonic thickening of the transverse colon with mural stratification. No colonic dilation. Bowel loops become gradually less distended with passing from distal to proximal. Jejunal loops are decompressed without signs of inflammation. There is evidence of open "creeping fat" about the affected segment of small bowel in the RIGHT lower quadrant. Vascular/Lymphatic: Portal vein again is patent. The IVC is smooth and normal caliber. No aneurysmal dilation of the abdominal aorta. Calcified and noncalcified plaque in the abdominal  aorta. There is no gastrohepatic or hepatoduodenal ligament lymphadenopathy. No retroperitoneal or mesenteric lymphadenopathy. Scattered small lymph nodes in the involved mesentery in the RIGHT lower quadrant. No pelvic sidewall lymphadenopathy. Reproductive: Unremarkable Other: No ascites. No abscess. Small fistula from the anastomotic site 2 adjacent small bowel best seen on image 45 of series 5 also seen on sagittal images. Is unclear whether this is an active fistula or a tract from previous fistulization but there is bridging between these 2 structures at the site of presumed stricture. Musculoskeletal: No acute  bone finding or destructive bone process. No signs of avascular necrosis of the femoral heads. Spinal degenerative changes. IMPRESSION: 1. Stigmata of suspected acute on chronic Crohn's disease with stricture and potential fistula associated with bowel obstruction though more proximal loops of small bowel are decompressed. 2. Bowel dilation may have a chronic component though is worse than on very remote studies that are available for comparison from 2009. Severity of obstruction may be exacerbated by an overlay of acute on chronic inflammatory bowel disease. GI consultation is suggested. 3. Suspected skip lesions in the colon with signs of colonic Crohn's disease. 4. Increasing biliary duct dilation with intrahepatic biliary duct distension in this patient with history of inflammatory bowel disease remains nonspecific following cholecystectomy but should be correlated with laboratory values to determine whether MRCP may be helpful and there is any clinical concern for [place]. 5. Aortic atherosclerosis. Electronically Signed   By: Zetta Bills M.D.   On: 03/19/2021 15:33      Impression / Plan:   Bridget Huber is a 70 y.o. female with longstanding history of Crohn's s/p ileocolectomy with ileocolonic anastomosis, with postop recurrence of Crohn's disease of the neoterminal ileum stricturing phenotype as well as possible enteroenteric fistula based on imaging presents with right lower quadrant pain, nausea and vomiting.  CT revealed evidence of small bowel obstruction and possible enteroenteric fistula at the site of neoterminal ileum and stricture.  Patient is currently maintained on Imuran 100 mg daily  No evidence of fever, leukocytosis, evidence of intra-abdominal abscess or fluid collection Recommend to start Solu-Medrol 60 mg daily Okay to defer antibiotics at this time Okay with clear liquid diet If patient continues to improve and tolerates liquids by mouth with bowel movements, recommend  inpatient colonoscopy.  Colonoscope to assess severity of disease Likely that patient will need Biologics due to postop recurrence of Crohn's and immunomodulators are not recommended as monotherapy in Crohn's disease Advised patient to quit smoking tobacco  Thank you for involving me in the care of this patient.      LOS: 0 days   Sherri Sear, MD  03/19/2021, 10:54 PM   Note: This dictation was prepared with Dragon dictation along with smaller phrase technology. Any transcriptional errors that result from this process are unintentional.

## 2021-03-19 NOTE — ED Notes (Signed)
Patient is resting comfortably. Call light within reach. Fall precautions in place. Husband at bedside.

## 2021-03-19 NOTE — H&P (Signed)
History and Physical    Bridget Huber [id number] DOB: [birth year] DOA: 03/19/2021  Referring MD/NP/PA:   PCP: Kirk Ruths, MD   Patient coming from:  The patient is coming from home.  At baseline, pt is independent for most of ADL.        Chief Complaint: Abdominal pain, nausea, vomiting, diarrhea  HPI: Bridget Huber is a 70 y.o. female with medical history significant of Crohn's disease, hypertension, COPD, remote DVT not on anticoagulants, CKD stage IIIa, anemia, who presents with abdominal pain, nausea, vomiting, diarrhea.  Patient states that she has been having abdominal pain for more than 1 week.  The abdominal pain is located in the middle and lower abdomen on both sides, constant, initially severe, currently mild, sharp, radiating to the back.  Associate with nausea and multiple episodes of nonbilious nonbloody vomiting each day, and diarrhea.  She has 1-3 episodes of diarrhea each day. No fever or chills. Denies chest pain, cough, shortness of breath.  No symptoms of UTI.  No unilateral weakness.  ED Course: pt was found to have WBC 5.7, pending COVID-19 PCR, worsening renal function, potassium 3.0, temperature 99, blood pressure 138/109, heart rate 100, 87, RR 18, oxygen saturation 92% on room air.  Patient is admitted to [place] bed as inpatient. Dr. Marius Ditch of GI is consulted.  CT-abd/plevis: 1. Stigmata of suspected acute on chronic Crohn's disease with stricture and potential fistula associated with bowel obstruction though more proximal loops of small bowel are decompressed. 2. Bowel dilation may have a chronic component though is worse than on very remote studies that are available for comparison from 2009. Severity of obstruction may be exacerbated by an overlay of acute on chronic inflammatory bowel disease. GI consultation is suggested. 3. Suspected skip lesions in the colon with signs of colonic Crohn's disease. 4. Increasing biliary duct dilation with  intrahepatic biliary duct distension in this patient with history of inflammatory bowel disease remains nonspecific following cholecystectomy but should be correlated with laboratory values to determine whether MRCP may be helpful and there is any clinical concern for [place]. 5. Aortic atherosclerosis.   Review of Systems:   General: no fevers, chills, no body weight gain, has poor appetite, has fatigue HEENT: no blurry vision, hearing changes or sore throat Respiratory: no dyspnea, coughing, wheezing CV: no chest pain, no palpitations GI: has nausea, vomiting, abdominal pain, diarrhea GU: no dysuria, burning on urination, increased urinary frequency, hematuria  Ext: no leg edema Neuro: no unilateral weakness, numbness, or tingling, no vision change or hearing loss Skin: no rash, no skin tear. MSK: No muscle spasm, no deformity, no limitation of range of movement in spin Heme: No easy bruising.  Travel history: No recent long distant travel.  Allergy:  Allergies  Allergen Reactions  . Aspirin     Past Medical History:  Diagnosis Date  . Anemia   . Arthritis   . Chronic kidney disease    kidney stones  . COPD (chronic obstructive pulmonary disease) ([place])   . Crohn's disease ([place])   . DVT (deep venous thrombosis) ([place])   . Hypertension     Past Surgical History:  Procedure Laterality Date  . COLONOSCOPY N/A 05/31/2015   Procedure: COLONOSCOPY;  Surgeon: Manya Silvas, MD;  Location: [facility] ENDOSCOPY;  Service: Endoscopy;  Laterality: N/A;    Social History:  reports that she has been smoking. She has never used smokeless tobacco. She reports previous alcohol use. She reports that she does  not use drugs.  Family History:  Family History  Problem Relation Age of Onset  . Hypertension Brother   . Diabetes Mellitus II Brother      Prior to Admission medications   Medication Sig Start Date End Date Taking? Authorizing Provider  acetaminophen (TYLENOL) 650 MG CR tablet  Take 650 mg by mouth every 8 (eight) hours as needed for pain.    [provider]  azaTHIOprine (IMURAN) 50 MG tablet Take 50 mg by mouth daily.    [provider]  Calcium Carbonate-Vitamin D 600-400 MG-UNIT per tablet Take 1 tablet by mouth daily.    [provider]  Multiple Vitamins-Minerals (MULTIVITAMIN WITH MINERALS) tablet Take 1 tablet by mouth daily.    [provider]    Physical Exam: Vitals:   03/19/21 1430 03/19/21 1600 03/19/21 1730 03/19/21 1744  BP: 129/75 (!) 138/109 118/64   Pulse: 79 87 78   Resp: 16 16 20    Temp:    98.8 F (37.1 C)  TempSrc:    Oral  SpO2: 98% 100% 98%   Weight:      Height:       General: Not in acute distress HEENT:       Eyes: PERRL, EOMI, no scleral icterus.       ENT: No discharge from the ears and nose, no pharynx injection, no tonsillar enlargement.        Neck: No JVD, no bruit, no mass felt. Heme: No neck lymph node enlargement. Cardiac: S1/S2, RRR, No murmurs, No gallops or rubs. Respiratory: No rales, wheezing, rhonchi or rubs. GI: Soft, nondistended, nontender, no rebound pain, no organomegaly, BS present. GU: No hematuria Ext: No pitting leg edema bilaterally. 1+DP/PT pulse bilaterally. Musculoskeletal: No joint deformities, No joint redness or warmth, no limitation of ROM in spin. Skin: No rashes.  Neuro: Alert, oriented X3, cranial nerves II-XII grossly intact, moves all extremities normally. Muscle strength 5/5 in all extremities, sensation to light touch intact. Brachial reflex 2+ bilaterally. Knee reflex 1+ bilaterally. Negative Babinski's sign. Normal finger to nose test. Psych: Patient is not psychotic, no suicidal or hemocidal ideation.  Labs on Admission: I have personally reviewed following labs and imaging studies  CBC: Recent Labs  Lab 03/19/21 1121  WBC 5.7  HGB 13.0  HCT 37.8  MCV 95.7  PLT 740   Basic Metabolic Panel: Recent Labs  Lab 03/19/21 1121  NA 136  K 3.0*   CL 108  CO2 17*  GLUCOSE 117*  BUN 22  CREATININE 1.62*  CALCIUM 8.9   GFR: Estimated Creatinine Clearance: 27.9 mL/min (A) (by C-G formula based on SCr of 1.62 mg/dL (H)). Liver Function Tests: Recent Labs  Lab 03/19/21 1121  AST 15  ALT 12  ALKPHOS 83  BILITOT 0.8  PROT 7.0  ALBUMIN 3.8   Recent Labs  Lab 03/19/21 1121  LIPASE 26   No results for input(s): AMMONIA in the last 168 hours. Coagulation Profile: No results for input(s): INR, PROTIME in the last 168 hours. Cardiac Enzymes: No results for input(s): CKTOTAL, CKMB, CKMBINDEX, TROPONINI in the last 168 hours. BNP (last 3 results) No results for input(s): PROBNP in the last 8760 hours. HbA1C: No results for input(s): HGBA1C in the last 72 hours. CBG: No results for input(s): GLUCAP in the last 168 hours. Lipid Profile: No results for input(s): CHOL, HDL, LDLCALC, TRIG, CHOLHDL, LDLDIRECT in the last 72 hours. Thyroid Function Tests: No results for input(s): TSH, T4TOTAL, FREET4, T3FREE,  THYROIDAB in the last 72 hours. Anemia Panel: No results for input(s): VITAMINB12, FOLATE, FERRITIN, TIBC, IRON, RETICCTPCT in the last 72 hours. Urine analysis: No results found for: COLORURINE, APPEARANCEUR, LABSPEC, PHURINE, GLUCOSEU, HGBUR, BILIRUBINUR, KETONESUR, PROTEINUR, UROBILINOGEN, NITRITE, LEUKOCYTESUR Sepsis Labs: @LABRCNTIP (procalcitonin:4,lacticidven:4) ) Recent Results (from the past 240 hour(s))  Resp Panel by RT-PCR (Flu A&B, Covid) Nasopharyngeal Swab     Status: None   Collection Time: 03/19/21  4:11 PM   Specimen: Nasopharyngeal Swab; Nasopharyngeal(NP) swabs in vial transport medium  Result Value Ref Range Status   SARS Coronavirus 2 by RT PCR NEGATIVE NEGATIVE Final    Comment: (NOTE) SARS-CoV-2 target nucleic acids are NOT DETECTED.  The SARS-CoV-2 RNA is generally detectable in upper respiratory specimens during the acute phase of infection. The lowest concentration of SARS-CoV-2 viral copies  this assay can detect is 138 copies/mL. A negative result does not preclude SARS-Cov-2 infection and should not be used as the sole basis for treatment or other patient management decisions. A negative result may occur with  improper specimen collection/handling, submission of specimen other than nasopharyngeal swab, presence of viral mutation(s) within the areas targeted by this assay, and inadequate number of viral copies(<138 copies/mL). A negative result must be combined with clinical observations, patient history, and epidemiological information. The expected result is Negative.  Fact Sheet for Patients:  EntrepreneurPulse.com.au  Fact Sheet for Healthcare Providers:  IncredibleEmployment.be  This test is no t yet approved or cleared by the [place] FDA and  has been authorized for detection and/or diagnosis of SARS-CoV-2 by FDA under an Emergency Use Authorization (EUA). This EUA will remain  in effect (meaning this test can be used) for the duration of the COVID-19 declaration under Section 564(b)(1) of the Act, 21 U.S.C.section 360bbb-3(b)(1), unless the authorization is terminated  or revoked sooner.       Influenza A by PCR NEGATIVE NEGATIVE Final   Influenza B by PCR NEGATIVE NEGATIVE Final    Comment: (NOTE) The Xpert Xpress SARS-CoV-2/FLU/RSV plus assay is intended as an aid in the diagnosis of influenza from Nasopharyngeal swab specimens and should not be used as a sole basis for treatment. Nasal washings and aspirates are unacceptable for Xpert Xpress SARS-CoV-2/FLU/RSV testing.  Fact Sheet for Patients: EntrepreneurPulse.com.au  Fact Sheet for Healthcare Providers: IncredibleEmployment.be  This test is not yet approved or cleared by the [place] FDA and has been authorized for detection and/or diagnosis of SARS-CoV-2 by FDA under an Emergency Use Authorization (EUA). This EUA  will remain in effect (meaning this test can be used) for the duration of the COVID-19 declaration under Section 564(b)(1) of the Act, 21 U.S.C. section 360bbb-3(b)(1), unless the authorization is terminated or revoked.  Performed at [facility], [address] [place], [place] [address]      Radiological Exams on Admission: CT ABDOMEN PELVIS W CONTRAST  Result Date: 03/19/2021 CLINICAL DATA:  Bowel obstruction suspected, history of inflammatory bowel disease with abdominal pain EXAM: CT ABDOMEN AND PELVIS WITH CONTRAST TECHNIQUE: Multidetector CT imaging of the abdomen and pelvis was performed using the standard protocol following bolus administration of intravenous contrast. CONTRAST:  44mL OMNIPAQUE IOHEXOL 300 MG/ML  SOLN COMPARISON:  Prior studies from 2009. FINDINGS: Lower chest: Incidental imaging of the lung bases without effusion or consolidation. Hepatobiliary: Post cholecystectomy with mild biliary duct distension this is increased since 2009 particularly with respect intrahepatic ducts with there is moderate intrahepatic biliary duct dilation of LEFT and RIGHT hepatic ducts. No focal, suspicious  hepatic lesion. The portal vein is patent. Pancreas: Normal, without mass, inflammation or ductal dilatation. Spleen: Normal Adrenals/Urinary Tract: Under distended stomach limiting assessment. Question gastric thickening though similar appearance seen on previous imaging. Post ileocecal resection with long segment narrowing and mural stratification of the neoterminal ileum on image 41 of series 2 this measures approximately 3.6 cm greatest length with 4.2 cm upstream bowel dilation in the distal ileum. Signs of colonic thickening of the transverse colon with mural stratification. No colonic dilation. Bowel loops become gradually less distended with passing from distal to proximal. Jejunal loops are decompressed without signs of inflammation. There is evidence of open "creeping fat"  about the affected segment of small bowel in the RIGHT lower quadrant. Vascular/Lymphatic: Portal vein again is patent. The IVC is smooth and normal caliber. No aneurysmal dilation of the abdominal aorta. Calcified and noncalcified plaque in the abdominal aorta. There is no gastrohepatic or hepatoduodenal ligament lymphadenopathy. No retroperitoneal or mesenteric lymphadenopathy. Scattered small lymph nodes in the involved mesentery in the RIGHT lower quadrant. No pelvic sidewall lymphadenopathy. Reproductive: Unremarkable Other: No ascites. No abscess. Small fistula from the anastomotic site 2 adjacent small bowel best seen on image 45 of series 5 also seen on sagittal images. Is unclear whether this is an active fistula or a tract from previous fistulization but there is bridging between these 2 structures at the site of presumed stricture. Musculoskeletal: No acute bone finding or destructive bone process. No signs of avascular necrosis of the femoral heads. Spinal degenerative changes. IMPRESSION: 1. Stigmata of suspected acute on chronic Crohn's disease with stricture and potential fistula associated with bowel obstruction though more proximal loops of small bowel are decompressed. 2. Bowel dilation may have a chronic component though is worse than on very remote studies that are available for comparison from 2009. Severity of obstruction may be exacerbated by an overlay of acute on chronic inflammatory bowel disease. GI consultation is suggested. 3. Suspected skip lesions in the colon with signs of colonic Crohn's disease. 4. Increasing biliary duct dilation with intrahepatic biliary duct distension in this patient with history of inflammatory bowel disease remains nonspecific following cholecystectomy but should be correlated with laboratory values to determine whether MRCP may be helpful and there is any clinical concern for [place]. 5. Aortic atherosclerosis. Electronically Signed   By: Zetta Bills M.D.    On: 03/19/2021 15:33     EKG: Not done in ED, will get one.   Assessment/Plan Principal Problem:   Acute Crohn's disease with intestinal obstruction (HCC) Active Problems:   COPD (chronic obstructive pulmonary disease) (HCC)   Hypertension   Acute renal failure superimposed on stage 3a chronic kidney disease (HCC)   Hypokalemia   Tobacco abuse   Iron deficiency anemia   Acute Crohn's disease with intestinal obstruction ([place]): Dr. Marius Ditch of GI is consulted. No need to consult surgery now. Will be treated with Solu-Medrol.  Likely colonoscopy on Thursday  -Admit to MedSurg biopsy patient -Solu-Medrol 60 mg daily -As needed morphine, Zofran -IV fluid: 1 L normal saline, then 75 cc/h -Follow-up C. difficile test -Clear liquid diet  COPD (chronic obstructive pulmonary disease) ([place]): Stable -As needed albuterol  Hypertension: Patient's not taking medications.  Blood pressure 138/109 -IV hydralazine, as needed  Acute renal failure superimposed on stage 3a chronic kidney disease ([place]): Baseline creatinine 1.1 on 11/27/2020.  Her creatinine is 1.62, BUN 22.  Most likely due to dehydration -IV fluid as above  Hypokalemia: Potassium 3.0 -Repleted potassium -Check  magnesium level  Tobacco abuse -Nicotine patch  Iron deficiency anemia: Hemoglobin stable 13.0 -Continue iron supplement    DVT ppx: SQ Heparin    Code Status: Full code Family Communication:  Yes, patient's brother   at bed side Disposition Plan:  Anticipate discharge back to previous environment Consults called: Dr. Marius Ditch for GI Admission status and Level of care: Med-Surg:     as inpt     Status is: Inpatient  Remains inpatient appropriate because:Inpatient level of care appropriate due to severity of illness   Dispo: The patient is from: Home              Anticipated d/c is to: Home              Patient currently is not medically stable to d/c.   Difficult to place patient No           Date  of Service 03/19/2021    [facility]   If 7PM-7AM, please contact night-coverage [email or web address] 03/19/2021, 5:44 PM

## 2021-03-19 NOTE — ED Provider Notes (Signed)
[facility] Emergency Department Provider Note    Event Date/Time   First MD Initiated Contact with Patient 03/19/21 1204     (approximate)  I have reviewed the triage vital signs and the nursing notes.   HISTORY  Chief Complaint Abdominal Pain    HPI Bridget Huber is a 70 y.o. female with a history of Crohn's disease presents to the ER for evaluation of nausea vomiting and generalized abdominal pain wrapping around her belly.  Is able to keep down liquids but not solids.  Has not passed gas in 24 hours.  No fevers.  No new medications.    Past Medical History:  Diagnosis Date  . Anemia   . Arthritis   . Chronic kidney disease    kidney stones  . COPD (chronic obstructive pulmonary disease) ([place])   . Crohn's disease ([place])   . DVT (deep venous thrombosis) ([place])   . Hypertension    No family history on file. Past Surgical History:  Procedure Laterality Date  . COLONOSCOPY N/A 05/31/2015   Procedure: COLONOSCOPY;  Surgeon: Manya Silvas, MD;  [facility] ENDOSCOPY;  Service: Endoscopy;  Laterality: N/A;   There are no problems to display for this patient.     Prior to Admission medications   Medication Sig Start Date End Date Taking? Authorizing Provider  acetaminophen (TYLENOL) 650 MG CR tablet Take 650 mg by mouth every 8 (eight) hours as needed for pain.    [provider]  azaTHIOprine (IMURAN) 50 MG tablet Take 50 mg by mouth daily.    [provider]  Calcium Carbonate-Vitamin D 600-400 MG-UNIT per tablet Take 1 tablet by mouth daily.    [provider]  Multiple Vitamins-Minerals (MULTIVITAMIN WITH MINERALS) tablet Take 1 tablet by mouth daily.    [provider]    Allergies Aspirin    Social History    Review of Systems Patient denies headaches, rhinorrhea, blurry vision, numbness, shortness of breath, chest pain, edema, cough, abdominal pain, nausea, vomiting, diarrhea, dysuria,  fevers, rashes or hallucinations unless otherwise stated above in HPI. ____________________________________________   PHYSICAL EXAM:  VITAL SIGNS: Vitals:   03/19/21 1121 03/19/21 1430  BP: (!) 132/97 129/75  Pulse: 100 79  Resp: 18 16  Temp: 99 F (37.2 C)   SpO2: 100% 98%    Constitutional: Alert and oriented.  Eyes: Conjunctivae are normal.  Head: Atraumatic. Nose: No congestion/rhinnorhea. Mouth/Throat: Mucous membranes are moist.   Neck: No stridor. Painless ROM.  Cardiovascular: Normal rate, regular rhythm. Grossly normal heart sounds.  Good peripheral circulation. Respiratory: Normal respiratory effort.  No retractions. Lungs CTAB. Gastrointestinal: Soft and mild generalized ttp. No distention. No abdominal bruits. No CVA tenderness. Genitourinary:  Musculoskeletal: No lower extremity tenderness nor edema.  No joint effusions. Neurologic:  Normal speech and language. No gross focal neurologic deficits are appreciated. No facial droop Skin:  Skin is warm, dry and intact. No rash noted. Psychiatric: Mood and affect are normal. Speech and behavior are normal.  ____________________________________________   LABS (all labs ordered are listed, but only abnormal results are displayed)  Results for orders placed or performed during the hospital encounter of 03/19/21 (from the past 24 hour(s))  Lipase, blood     Status: None   Collection Time: 03/19/21 11:21 AM  Result Value Ref Range   Lipase 26 11 - 51 U/L  Comprehensive metabolic panel     Status: Abnormal   Collection Time: 03/19/21 11:21 AM  Result Value Ref Range   Sodium 136 135 - 145 mmol/L   Potassium 3.0 (L) 3.5 - 5.1 mmol/L   Chloride 108 98 - 111 mmol/L   CO2 17 (L) 22 - 32 mmol/L   Glucose, Bld 117 (H) 70 - 99 mg/dL   BUN 22 8 - 23 mg/dL   Creatinine, Ser 1.62 (H) 0.44 - 1.00 mg/dL   Calcium 8.9 8.9 - 10.3 mg/dL   Total Protein 7.0 6.5 - 8.1 g/dL   Albumin 3.8 3.5 - 5.0 g/dL   AST 15 15 - 41 U/L    ALT 12 0 - 44 U/L   Alkaline Phosphatase 83 38 - 126 U/L   Total Bilirubin 0.8 0.3 - 1.2 mg/dL   GFR, Estimated 34 (L) >60 mL/min   Anion gap 11 5 - 15  CBC     Status: Abnormal   Collection Time: 03/19/21 11:21 AM  Result Value Ref Range   WBC 5.7 4.0 - 10.5 K/uL   RBC 3.95 3.87 - 5.11 MIL/uL   Hemoglobin 13.0 12.0 - 15.0 g/dL   HCT 37.8 36.0 - 46.0 %   MCV 95.7 80.0 - 100.0 fL   MCH 32.9 26.0 - 34.0 pg   MCHC 34.4 30.0 - 36.0 g/dL   RDW 14.2 11.5 - 15.5 %   Platelets 305 150 - 400 K/uL   nRBC 0.5 (H) 0.0 - 0.2 %   ____________________________________________ ____________________________________________  RADIOLOGY  I personally reviewed all radiographic images ordered to evaluate for the above acute complaints and reviewed radiology reports and findings.  These findings were personally discussed with the patient.  Please see medical record for radiology report.  ____________________________________________   PROCEDURES  Procedure(s) performed:  Procedures    Critical Care performed: no ____________________________________________   INITIAL IMPRESSION / ASSESSMENT AND PLAN / ED COURSE  Pertinent labs & imaging results that were available during my care of the patient were reviewed by me and considered in my medical decision making (see chart for details).   DDX: Crohn's, SBO, enteritis, colitis, diverticulitis, fistula  Bridget Huber is a 70 y.o. who presents to the ED with presentation as described above.  Patient presenting with symptoms as described above.  Given her history of Crohn's blood work was ordered which was fortunately without any significant derangement but given her complicating history on immunosuppression CT imaging of the abdomen will be ordered.  Will give IV fluids as well as IV narcotic medication IV antiemetics.  Clinical Course as of 03/19/21 1601  Tue Mar 19, 2021  1544 CT abdomen shows evidence of acute on chronic Crohn's colitis with  stricture and probable bowel obstruction though proximal loops of bowel are decompressed.  Will consult GI. [PR]  1600 Discussed case with Dr. Marius Ditch of GI who recommends IV Solu-Medrol.  Agrees with plan for admission for IV fluids symptomatic management and keep NPO.  No indication for NG tube right now.  Patient denies any any severe nausea or vomiting at this time.  Have discussed with the patient and available family all diagnostics and treatments performed thus far and all questions were answered to the best of my ability. The patient demonstrates understanding and agreement with plan.  [PR]    Clinical Course User Index [PR] Merlyn Lot, MD    The patient was evaluated in Emergency Department today for the symptoms described in the history of present illness. He/she was evaluated in the context of the global COVID-19 pandemic, which necessitated consideration that  the patient might be at risk for infection with the SARS-CoV-2 virus that causes COVID-19. Institutional protocols and algorithms that pertain to the evaluation of patients at risk for COVID-19 are in a state of rapid change based on information released by regulatory bodies including the CDC and federal and state organizations. These policies and algorithms were followed during the patient's care in the ED.  As part of my medical decision making, I reviewed the following data within the [place] notes reviewed and incorporated, Labs reviewed, notes from prior ED visits and [place] Controlled Substance Database   ____________________________________________   FINAL CLINICAL IMPRESSION(S) / ED DIAGNOSES  Final diagnoses:  Generalized abdominal pain  Acute Crohn's disease with intestinal obstruction ([place])      NEW MEDICATIONS STARTED DURING THIS VISIT:  New Prescriptions   No medications on file     Note:  This document was prepared using Dragon voice recognition software and may include  unintentional dictation errors.    Merlyn Lot, MD 03/19/21 [phone]

## 2021-03-20 LAB — CBC
HCT: 33.5 % — ABNORMAL LOW (ref 36.0–46.0)
Hemoglobin: 11.7 g/dL — ABNORMAL LOW (ref 12.0–15.0)
MCH: 33.6 pg (ref 26.0–34.0)
MCHC: 34.9 g/dL (ref 30.0–36.0)
MCV: 96.3 fL (ref 80.0–100.0)
Platelets: 267 10*3/uL (ref 150–400)
RBC: 3.48 MIL/uL — ABNORMAL LOW (ref 3.87–5.11)
RDW: 14.2 % (ref 11.5–15.5)
WBC: 4.8 10*3/uL (ref 4.0–10.5)
nRBC: 0.4 % — ABNORMAL HIGH (ref 0.0–0.2)

## 2021-03-20 LAB — BASIC METABOLIC PANEL
Anion gap: 8 (ref 5–15)
BUN: 17 mg/dL (ref 8–23)
CO2: 20 mmol/L — ABNORMAL LOW (ref 22–32)
Calcium: 8.5 mg/dL — ABNORMAL LOW (ref 8.9–10.3)
Chloride: 110 mmol/L (ref 98–111)
Creatinine, Ser: 1.33 mg/dL — ABNORMAL HIGH (ref 0.44–1.00)
GFR, Estimated: 43 mL/min — ABNORMAL LOW (ref >60)
Glucose, Bld: 107 mg/dL — ABNORMAL HIGH (ref 70–99)
Potassium: 4.2 mmol/L (ref 3.5–5.1)
Sodium: 138 mmol/L (ref 135–145)

## 2021-03-20 LAB — C DIFFICILE QUICK SCREEN W PCR REFLEX
C Diff antigen: NEGATIVE
C Diff interpretation: NOT DETECTED
C Diff toxin: NEGATIVE

## 2021-03-20 LAB — GLUCOSE, CAPILLARY: Glucose-Capillary: 103 mg/dL — ABNORMAL HIGH (ref 70–99)

## 2021-03-20 MED ORDER — MAGNESIUM SULFATE 2 GM/50ML IV SOLN
2.0000 g | Freq: Once | INTRAVENOUS | Status: AC
  Administered 2021-03-20: 2 g via INTRAVENOUS
  Filled 2021-03-20: qty 50

## 2021-03-20 MED ORDER — POLYETHYLENE GLYCOL 3350 17 GM/SCOOP PO POWD
1.0000 | Freq: Once | ORAL | Status: AC
  Administered 2021-03-20: 255 g via ORAL
  Filled 2021-03-20: qty 255

## 2021-03-20 MED ORDER — BOOST / RESOURCE BREEZE PO LIQD CUSTOM
1.0000 | Freq: Three times a day (TID) | ORAL | Status: DC
  Administered 2021-03-20: 1 via ORAL

## 2021-03-20 NOTE — Progress Notes (Signed)
   03/20/21 1200  Clinical Encounter Type  Visited With Patient  Visit Type Initial;Spiritual support;Social support  Referral From Other (Comment) (Self- Chaplain Rounds)  Consult/Referral To Nurse   Chaplain visited PT while doing rounds. PT was able to speak about her experience in the hospital. She was incredibly pleased with the staff. She stated this was her first time in the hospital in 20 years. She spoke about her deep faith in God. Chaplain provided emotional and spiritual support.

## 2021-03-20 NOTE — Progress Notes (Signed)
PROGRESS NOTE    Bridget Huber  KXF:[id number] DOB: 1951/08/13 DOA: 03/19/2021 PCP: Kirk Ruths, MD    Chief Complaint  Patient presents with  . Abdominal Pain    Brief Narrative:   Bridget Huber is a 70 y.o. female with medical history significant of Crohn's disease, hypertension, COPD, remote DVT not on anticoagulants, CKD stage IIIa, anemia, who presents with abdominal pain, nausea, vomiting, diarrhea. Admitted for acute Crohn's flareup and SBO  Subjective:   She is drinking bowel prep Reports some nausea and mid ab pain, no vomiting, no fever Family at bedside   Assessment & Plan:   Principal Problem:   Acute Crohn's disease with intestinal obstruction ([place]) Active Problems:   COPD (chronic obstructive pulmonary disease) ([place])   Hypertension   Acute renal failure superimposed on stage 3a chronic kidney disease (HCC)   Hypokalemia   Tobacco abuse   Iron deficiency anemia   Acute Crohn's flareup with SBO -GI consulted, currently on Solu-Medrol 60 mg IV daily, Imuran 100 mg daily -No active vomiting, she is tolerating clears, GI plan to do colonoscopy tomorrow -We will follow GI recommendation  Acute renal failure superimposed on stage 3a chronic kidney disease (HCC)/metabolic acidosis:  -Baseline creatinine 1.1 on 11/27/2020.   -Her creatinine is 1.62, BUN 22.  On presentation,  Most likely due to dehydration -IV fluid as above, bun/cr improving, bicarb improving, renal dosing meds  Hypokalemia/hypomagnesemia:  Potassium 3.0, mag 1.5 on presentation -Repleted potassium, give IV mag today -Check in the morning   Iron deficiency anemia: Hemoglobin stable 13.0 -Continue iron supplement  COPD (chronic obstructive pulmonary disease) ([place]): Stable -As needed albuterol  Tobacco abuse -Nicotine patch    Nutritional Assessment: The patient's BMI is: Body mass index is 25.73 kg/m.Marland Kitchen Seen by dietician.  I agree with the assessment and plan as  outlined below: Nutrition Status: Nutrition Problem: Inadequate oral intake Etiology: altered GI function Signs/Symptoms: per patient/family report Interventions: Boost Breeze,MVI  .    Unresulted Labs (From admission, onward)          Start     Ordered   03/21/21 0500  CBC with Differential/Platelet  Tomorrow morning,   R        03/20/21 1645   03/21/21 6967  Basic metabolic panel  Tomorrow morning,   R        03/20/21 1645   03/21/21 0500  Magnesium  Tomorrow morning,   R        03/20/21 1645            DVT prophylaxis: heparin injection 5,000 Units Start: 03/19/21 2200   Code Status: Full Family Communication: Family at bedside Disposition:   Status is: Inpatient  Dispo: The patient is from: Home              Anticipated d/c is to: Home              Anticipated d/c date is: To be determined, need GI clearance              Consultants:   GI  Procedures:   Colonoscopy  Antimicrobials:   None     Objective: Vitals:   03/20/21 0405 03/20/21 0745 03/20/21 1106 03/20/21 1519  BP: 106/66 124/78 128/73 127/71  Pulse: 71 78 73 74  Resp: 20 18  18   Temp: 97.9 F (36.6 C) 98.5 F (36.9 C) 98 F (36.7 C) 98.1 F (36.7 C)  TempSrc: Oral  Oral Oral  SpO2: 100% 100% 98% 100%  Weight:      Height:        Intake/Output Summary (Last 24 hours) at 03/20/2021 1645 Last data filed at 03/20/2021 1423 Gross per 24 hour  Intake 2516.57 ml  Output --  Net 2516.57 ml   Filed Weights   03/19/21 1120 03/19/21 1950  Weight: 63.5 kg 63.8 kg    Examination:  General exam: calm, NAD Respiratory system: Clear to auscultation. Respiratory effort normal. Cardiovascular system: S1 & S2 heard, RRR. No JVD, no murmur, No pedal edema. Gastrointestinal system: Abdomen is nondistended, soft , mild mid ab tender, no gaurding, no rebound . Normal bowel sounds heard. Central nervous system: Alert and oriented. No focal neurological deficits. Extremities: Symmetric 5 x  5 power. Skin: No rashes, lesions or ulcers Psychiatry: Judgement and insight appear normal. Mood & affect appropriate.     Data Reviewed: I have personally reviewed following labs and imaging studies  CBC: Recent Labs  Lab 03/19/21 1121 03/20/21 0618  WBC 5.7 4.8  HGB 13.0 11.7*  HCT 37.8 33.5*  MCV 95.7 96.3  PLT 305 967    Basic Metabolic Panel: Recent Labs  Lab 03/19/21 1121 03/20/21 0618  NA 136 138  K 3.0* 4.2  CL 108 110  CO2 17* 20*  GLUCOSE 117* 107*  BUN 22 17  CREATININE 1.62* 1.33*  CALCIUM 8.9 8.5*  MG 1.5*  --     GFR: Estimated Creatinine Clearance: 34.1 mL/min (A) (by C-G formula based on SCr of 1.33 mg/dL (H)).  Liver Function Tests: Recent Labs  Lab 03/19/21 1121  AST 15  ALT 12  ALKPHOS 83  BILITOT 0.8  PROT 7.0  ALBUMIN 3.8    CBG: Recent Labs  Lab 03/20/21 0741  GLUCAP 103*     Recent Results (from the past 240 hour(s))  C Difficile Quick Screen w PCR reflex     Status: None   Collection Time: 03/19/21  5:00 AM   Specimen: STOOL  Result Value Ref Range Status   C Diff antigen NEGATIVE NEGATIVE Final   C Diff toxin NEGATIVE NEGATIVE Final   C Diff interpretation No C. difficile detected.  Final    Comment: Performed at [facility], [address] [place], [place] [address]  Resp Panel by RT-PCR (Flu A&B, Covid) Nasopharyngeal Swab     Status: None   Collection Time: 03/19/21  4:11 PM   Specimen: Nasopharyngeal Swab; Nasopharyngeal(NP) swabs in vial transport medium  Result Value Ref Range Status   SARS Coronavirus 2 by RT PCR NEGATIVE NEGATIVE Final    Comment: (NOTE) SARS-CoV-2 target nucleic acids are NOT DETECTED.  The SARS-CoV-2 RNA is generally detectable in upper respiratory specimens during the acute phase of infection. The lowest concentration of SARS-CoV-2 viral copies this assay can detect is 138 copies/mL. A negative result does not preclude SARS-Cov-2 infection and should not be used as the  sole basis for treatment or other patient management decisions. A negative result may occur with  improper specimen collection/handling, submission of specimen other than nasopharyngeal swab, presence of viral mutation(s) within the areas targeted by this assay, and inadequate number of viral copies(<138 copies/mL). A negative result must be combined with clinical observations, patient history, and epidemiological information. The expected result is Negative.  Fact Sheet for Patients:  EntrepreneurPulse.com.au  Fact Sheet for Healthcare Providers:  IncredibleEmployment.be  This test is no t yet approved or cleared by the [place] FDA and  has  been authorized for detection and/or diagnosis of SARS-CoV-2 by FDA under an Emergency Use Authorization (EUA). This EUA will remain  in effect (meaning this test can be used) for the duration of the COVID-19 declaration under Section 564(b)(1) of the Act, 21 U.S.C.section 360bbb-3(b)(1), unless the authorization is terminated  or revoked sooner.       Influenza A by PCR NEGATIVE NEGATIVE Final   Influenza B by PCR NEGATIVE NEGATIVE Final    Comment: (NOTE) The Xpert Xpress SARS-CoV-2/FLU/RSV plus assay is intended as an aid in the diagnosis of influenza from Nasopharyngeal swab specimens and should not be used as a sole basis for treatment. Nasal washings and aspirates are unacceptable for Xpert Xpress SARS-CoV-2/FLU/RSV testing.  Fact Sheet for Patients: EntrepreneurPulse.com.au  Fact Sheet for Healthcare Providers: IncredibleEmployment.be  This test is not yet approved or cleared by the [place] FDA and has been authorized for detection and/or diagnosis of SARS-CoV-2 by FDA under an Emergency Use Authorization (EUA). This EUA will remain in effect (meaning this test can be used) for the duration of the COVID-19 declaration under Section 564(b)(1) of the  Act, 21 U.S.C. section 360bbb-3(b)(1), unless the authorization is terminated or revoked.  Performed at [facility], [address] [place], [place] [address]          Radiology Studies: CT ABDOMEN PELVIS W CONTRAST  Result Date: 03/19/2021 CLINICAL DATA:  Bowel obstruction suspected, history of inflammatory bowel disease with abdominal pain EXAM: CT ABDOMEN AND PELVIS WITH CONTRAST TECHNIQUE: Multidetector CT imaging of the abdomen and pelvis was performed using the standard protocol following bolus administration of intravenous contrast. CONTRAST:  33mL OMNIPAQUE IOHEXOL 300 MG/ML  SOLN COMPARISON:  Prior studies from 2009. FINDINGS: Lower chest: Incidental imaging of the lung bases without effusion or consolidation. Hepatobiliary: Post cholecystectomy with mild biliary duct distension this is increased since 2009 particularly with respect intrahepatic ducts with there is moderate intrahepatic biliary duct dilation of LEFT and RIGHT hepatic ducts. No focal, suspicious hepatic lesion. The portal vein is patent. Pancreas: Normal, without mass, inflammation or ductal dilatation. Spleen: Normal Adrenals/Urinary Tract: Under distended stomach limiting assessment. Question gastric thickening though similar appearance seen on previous imaging. Post ileocecal resection with long segment narrowing and mural stratification of the neoterminal ileum on image 41 of series 2 this measures approximately 3.6 cm greatest length with 4.2 cm upstream bowel dilation in the distal ileum. Signs of colonic thickening of the transverse colon with mural stratification. No colonic dilation. Bowel loops become gradually less distended with passing from distal to proximal. Jejunal loops are decompressed without signs of inflammation. There is evidence of open "creeping fat" about the affected segment of small bowel in the RIGHT lower quadrant. Vascular/Lymphatic: Portal vein again is patent. The IVC is smooth and  normal caliber. No aneurysmal dilation of the abdominal aorta. Calcified and noncalcified plaque in the abdominal aorta. There is no gastrohepatic or hepatoduodenal ligament lymphadenopathy. No retroperitoneal or mesenteric lymphadenopathy. Scattered small lymph nodes in the involved mesentery in the RIGHT lower quadrant. No pelvic sidewall lymphadenopathy. Reproductive: Unremarkable Other: No ascites. No abscess. Small fistula from the anastomotic site 2 adjacent small bowel best seen on image 45 of series 5 also seen on sagittal images. Is unclear whether this is an active fistula or a tract from previous fistulization but there is bridging between these 2 structures at the site of presumed stricture. Musculoskeletal: No acute bone finding or destructive bone process. No signs of avascular necrosis of the  femoral heads. Spinal degenerative changes. IMPRESSION: 1. Stigmata of suspected acute on chronic Crohn's disease with stricture and potential fistula associated with bowel obstruction though more proximal loops of small bowel are decompressed. 2. Bowel dilation may have a chronic component though is worse than on very remote studies that are available for comparison from 2009. Severity of obstruction may be exacerbated by an overlay of acute on chronic inflammatory bowel disease. GI consultation is suggested. 3. Suspected skip lesions in the colon with signs of colonic Crohn's disease. 4. Increasing biliary duct dilation with intrahepatic biliary duct distension in this patient with history of inflammatory bowel disease remains nonspecific following cholecystectomy but should be correlated with laboratory values to determine whether MRCP may be helpful and there is any clinical concern for [place]. 5. Aortic atherosclerosis. Electronically Signed   By: Zetta Bills M.D.   On: 03/19/2021 15:33        Scheduled Meds: . azaTHIOprine  100 mg Oral Daily  . calcium-vitamin D  1 tablet Oral BID  .  cholecalciferol  2,000 Units Oral Daily  . feeding supplement  1 Container Oral TID BM  . heparin  5,000 Units Subcutaneous Q8H  . methylPREDNISolone (SOLU-MEDROL) injection  60 mg Intravenous Daily  . multivitamin with minerals  1 tablet Oral Daily  . nicotine  21 mg Transdermal Daily  . pantoprazole  40 mg Oral Daily  . cyanocobalamin  1,000 mcg Oral Daily   Continuous Infusions: . magnesium sulfate bolus IVPB       LOS: 1 day   Time spent: 76mins Greater than 50% of this time was spent in counseling, explanation of diagnosis, planning of further management, and coordination of care.   Voice Recognition Viviann Spare dictation system was used to create this note, attempts have been made to correct errors. Please contact the author with questions and/or clarifications.   Florencia Reasons, MD PhD FACP [facility]  Available via Epic secure chat 7am-7pm for nonurgent issues Please page for urgent issues To page the attending provider between 7A-7P or the covering provider during after hours 7P-7A, please log into the web site [email or web address] and access using universal [place] password for that web site. If you do not have the password, please call the hospital operator.    03/20/2021, 4:45 PM

## 2021-03-20 NOTE — Progress Notes (Signed)
[name], MD [address]  [place]  [place], [place] [address]  Main: [phone]  Fax: [phone] Pager: [phone]   Subjective: Patient is doing well today, remains asymptomatic, denies any GI symptoms.  She started bowel prep and tolerating well.  She is on clear liquid diet   Objective: Vital signs in last 24 hours: Vitals:   03/20/21 0405 03/20/21 0745 03/20/21 1106 03/20/21 1519  BP: 106/66 124/78 128/73 127/71  Pulse: 71 78 73 74  Resp: 20 18  18   Temp: 97.9 F (36.6 C) 98.5 F (36.9 C) 98 F (36.7 C) 98.1 F (36.7 C)  TempSrc: Oral  Oral Oral  SpO2: 100% 100% 98% 100%  Weight:      Height:       Weight change:   Intake/Output Summary (Last 24 hours) at 03/20/2021 1634 Last data filed at 03/20/2021 1423 Gross per 24 hour  Intake 2516.57 ml  Output --  Net 2516.57 ml     Exam: Heart:: Regular rate and rhythm, S1S2 present or without murmur or extra heart sounds Lungs: normal and clear to auscultation Abdomen: soft, nontender, normal bowel sounds   Lab Results: CBC Latest Ref Rng & Units 03/20/2021 03/19/2021  WBC 4.0 - 10.5 K/uL 4.8 5.7  Hemoglobin 12.0 - 15.0 g/dL 11.7(L) 13.0  Hematocrit 36.0 - 46.0 % 33.5(L) 37.8  Platelets 150 - 400 K/uL 267 305   CMP Latest Ref Rng & Units 03/20/2021 03/19/2021  Glucose 70 - 99 mg/dL 107(H) 117(H)  BUN 8 - 23 mg/dL 17 22  Creatinine 0.44 - 1.00 mg/dL 1.33(H) 1.62(H)  Sodium 135 - 145 mmol/L 138 136  Potassium 3.5 - 5.1 mmol/L 4.2 3.0(L)  Chloride 98 - 111 mmol/L 110 108  CO2 22 - 32 mmol/L 20(L) 17(L)  Calcium 8.9 - 10.3 mg/dL 8.5(L) 8.9  Total Protein 6.5 - 8.1 g/dL - 7.0  Total Bilirubin 0.3 - 1.2 mg/dL - 0.8  Alkaline Phos 38 - 126 U/L - 83  AST 15 - 41 U/L - 15  ALT 0 - 44 U/L - 12    Micro Results: Recent Results (from the past 240 hour(s))  C Difficile Quick Screen w PCR reflex     Status: None   Collection Time: 03/19/21  5:00 AM   Specimen: STOOL  Result Value Ref Range  Status   C Diff antigen NEGATIVE NEGATIVE Final   C Diff toxin NEGATIVE NEGATIVE Final   C Diff interpretation No C. difficile detected.  Final    Comment: Performed at [facility], [address] [place], [place] [address]  Resp Panel by RT-PCR (Flu A&B, Covid) Nasopharyngeal Swab     Status: None   Collection Time: 03/19/21  4:11 PM   Specimen: Nasopharyngeal Swab; Nasopharyngeal(NP) swabs in vial transport medium  Result Value Ref Range Status   SARS Coronavirus 2 by RT PCR NEGATIVE NEGATIVE Final    Comment: (NOTE) SARS-CoV-2 target nucleic acids are NOT DETECTED.  The SARS-CoV-2 RNA is generally detectable in upper respiratory specimens during the acute phase of infection. The lowest concentration of SARS-CoV-2 viral copies this assay can detect is 138 copies/mL. A negative result does not preclude SARS-Cov-2 infection and should not be used as the sole basis for treatment or other patient management decisions. A negative result may occur with  improper specimen collection/handling, submission of specimen other than nasopharyngeal swab, presence of viral mutation(s) within the areas targeted by this assay, and inadequate number of  viral copies(<138 copies/mL). A negative result must be combined with clinical observations, patient history, and epidemiological information. The expected result is Negative.  Fact Sheet for Patients:  EntrepreneurPulse.com.au  Fact Sheet for Healthcare Providers:  IncredibleEmployment.be  This test is no t yet approved or cleared by the [place] FDA and  has been authorized for detection and/or diagnosis of SARS-CoV-2 by FDA under an Emergency Use Authorization (EUA). This EUA will remain  in effect (meaning this test can be used) for the duration of the COVID-19 declaration under Section 564(b)(1) of the Act, 21 U.S.C.section 360bbb-3(b)(1), unless the authorization is terminated  or revoked  sooner.       Influenza A by PCR NEGATIVE NEGATIVE Final   Influenza B by PCR NEGATIVE NEGATIVE Final    Comment: (NOTE) The Xpert Xpress SARS-CoV-2/FLU/RSV plus assay is intended as an aid in the diagnosis of influenza from Nasopharyngeal swab specimens and should not be used as a sole basis for treatment. Nasal washings and aspirates are unacceptable for Xpert Xpress SARS-CoV-2/FLU/RSV testing.  Fact Sheet for Patients: EntrepreneurPulse.com.au  Fact Sheet for Healthcare Providers: IncredibleEmployment.be  This test is not yet approved or cleared by the [place] FDA and has been authorized for detection and/or diagnosis of SARS-CoV-2 by FDA under an Emergency Use Authorization (EUA). This EUA will remain in effect (meaning this test can be used) for the duration of the COVID-19 declaration under Section 564(b)(1) of the Act, 21 U.S.C. section 360bbb-3(b)(1), unless the authorization is terminated or revoked.  Performed at [facility], [address] [place], [place] [address]    Studies/Results: CT ABDOMEN PELVIS W CONTRAST  Result Date: 03/19/2021 CLINICAL DATA:  Bowel obstruction suspected, history of inflammatory bowel disease with abdominal pain EXAM: CT ABDOMEN AND PELVIS WITH CONTRAST TECHNIQUE: Multidetector CT imaging of the abdomen and pelvis was performed using the standard protocol following bolus administration of intravenous contrast. CONTRAST:  31mL OMNIPAQUE IOHEXOL 300 MG/ML  SOLN COMPARISON:  Prior studies from 2009. FINDINGS: Lower chest: Incidental imaging of the lung bases without effusion or consolidation. Hepatobiliary: Post cholecystectomy with mild biliary duct distension this is increased since 2009 particularly with respect intrahepatic ducts with there is moderate intrahepatic biliary duct dilation of LEFT and RIGHT hepatic ducts. No focal, suspicious hepatic lesion. The portal vein is patent. Pancreas:  Normal, without mass, inflammation or ductal dilatation. Spleen: Normal Adrenals/Urinary Tract: Under distended stomach limiting assessment. Question gastric thickening though similar appearance seen on previous imaging. Post ileocecal resection with long segment narrowing and mural stratification of the neoterminal ileum on image 41 of series 2 this measures approximately 3.6 cm greatest length with 4.2 cm upstream bowel dilation in the distal ileum. Signs of colonic thickening of the transverse colon with mural stratification. No colonic dilation. Bowel loops become gradually less distended with passing from distal to proximal. Jejunal loops are decompressed without signs of inflammation. There is evidence of open "creeping fat" about the affected segment of small bowel in the RIGHT lower quadrant. Vascular/Lymphatic: Portal vein again is patent. The IVC is smooth and normal caliber. No aneurysmal dilation of the abdominal aorta. Calcified and noncalcified plaque in the abdominal aorta. There is no gastrohepatic or hepatoduodenal ligament lymphadenopathy. No retroperitoneal or mesenteric lymphadenopathy. Scattered small lymph nodes in the involved mesentery in the RIGHT lower quadrant. No pelvic sidewall lymphadenopathy. Reproductive: Unremarkable Other: No ascites. No abscess. Small fistula from the anastomotic site 2 adjacent small bowel best seen on image 45 of series 5 also  seen on sagittal images. Is unclear whether this is an active fistula or a tract from previous fistulization but there is bridging between these 2 structures at the site of presumed stricture. Musculoskeletal: No acute bone finding or destructive bone process. No signs of avascular necrosis of the femoral heads. Spinal degenerative changes. IMPRESSION: 1. Stigmata of suspected acute on chronic Crohn's disease with stricture and potential fistula associated with bowel obstruction though more proximal loops of small bowel are decompressed. 2.  Bowel dilation may have a chronic component though is worse than on very remote studies that are available for comparison from 2009. Severity of obstruction may be exacerbated by an overlay of acute on chronic inflammatory bowel disease. GI consultation is suggested. 3. Suspected skip lesions in the colon with signs of colonic Crohn's disease. 4. Increasing biliary duct dilation with intrahepatic biliary duct distension in this patient with history of inflammatory bowel disease remains nonspecific following cholecystectomy but should be correlated with laboratory values to determine whether MRCP may be helpful and there is any clinical concern for [place]. 5. Aortic atherosclerosis. Electronically Signed   By: Zetta Bills M.D.   On: 03/19/2021 15:33   Medications:  I have reviewed the patient's current medications. Prior to Admission:  Medications Prior to Admission  Medication Sig Dispense Refill Last Dose  . acetaminophen (TYLENOL) 650 MG CR tablet Take 1,300 mg by mouth every 8 (eight) hours as needed for pain.   03/19/2021 at 0300  . azaTHIOprine (IMURAN) 50 MG tablet Take 100 mg by mouth daily.   03/18/2021 at 1100  . Calcium Carbonate-Vitamin D 600-400 MG-UNIT tablet Take 1 tablet by mouth 2 (two) times daily.   03/18/2021 at 0830  . Cholecalciferol 50 MCG (2000 UT) CAPS Take 2,000 Units by mouth daily.   03/18/2021 at 0830  . cyanocobalamin 1000 MCG tablet Take 1,000 mcg by mouth daily.   03/18/2021 at 0830  . ferrous sulfate 325 (65 FE) MG tablet Take 325 mg by mouth daily.   03/18/2021 at 1100  . Multiple Vitamin (MULTI-VITAMIN) tablet Take 1 tablet by mouth daily.   03/18/2021 at 1100  . omeprazole (PRILOSEC) 20 MG capsule Take 20 mg by mouth daily.   03/18/2021 at 1100   Scheduled: . azaTHIOprine  100 mg Oral Daily  . calcium-vitamin D  1 tablet Oral BID  . cholecalciferol  2,000 Units Oral Daily  . feeding supplement  1 Container Oral TID BM  . heparin  5,000 Units Subcutaneous Q8H  .  methylPREDNISolone (SOLU-MEDROL) injection  60 mg Intravenous Daily  . multivitamin with minerals  1 tablet Oral Daily  . nicotine  21 mg Transdermal Daily  . pantoprazole  40 mg Oral Daily  . cyanocobalamin  1,000 mcg Oral Daily   Continuous:  OZD:[id number], acetaminophen, albuterol, dextromethorphan-guaiFENesin, hydrALAZINE, morphine injection, ondansetron (ZOFRAN) IV Anti-infectives (From admission, onward)   None     Scheduled Meds: . azaTHIOprine  100 mg Oral Daily  . calcium-vitamin D  1 tablet Oral BID  . cholecalciferol  2,000 Units Oral Daily  . feeding supplement  1 Container Oral TID BM  . heparin  5,000 Units Subcutaneous Q8H  . methylPREDNISolone (SOLU-MEDROL) injection  60 mg Intravenous Daily  . multivitamin with minerals  1 tablet Oral Daily  . nicotine  21 mg Transdermal Daily  . pantoprazole  40 mg Oral Daily  . cyanocobalamin  1,000 mcg Oral Daily   Continuous Infusions:  PRN Meds:.acetaminophen, acetaminophen, albuterol, dextromethorphan-guaiFENesin, hydrALAZINE, morphine injection, ondansetron (  ZOFRAN) IV   Assessment: Principal Problem:   Acute Crohn's disease with intestinal obstruction ([place]) Active Problems:   COPD (chronic obstructive pulmonary disease) (HCC)   Hypertension   Acute renal failure superimposed on stage 3a chronic kidney disease (HCC)   Hypokalemia   Tobacco abuse   Iron deficiency anemia  Complicated small bowel Crohn's, ileocolectomy with postop recurrence is admitted with small bowel obstruction, acute exacerbation of Crohn's disease of the neoterminal ileum and possible enteroenteric fistula without evidence of abscess based on imaging C. difficile negative Patient is doing clinically well on Solu-Medrol and tolerating clear liquids, having bowel movements  Plan: Continue azathioprine 100 mg daily Continue Solu-Medrol 60 mg IV daily Recommend colonoscopy with TI evaluation using pediatric colonoscope   LOS: 1 day    Deloyce Walthers 03/20/2021, 4:34 PM

## 2021-03-20 NOTE — Progress Notes (Signed)
Initial Nutrition Assessment  DOCUMENTATION CODES:   Not applicable  INTERVENTION:   -MVI with minerals daily -Boost Breeze po TID, each supplement provides 250 kcal and 9 grams of protein -RD will follow for diet advancement and adjust supplement regimen as appropriate  NUTRITION DIAGNOSIS:   Inadequate oral intake related to altered GI function as evidenced by per patient/family report.  GOAL:   Patient will meet greater than or equal to 90% of their needs  MONITOR:   PO intake,Supplement acceptance,Diet advancement,Labs,Weight trends,Skin,I & O's  REASON FOR ASSESSMENT:   Malnutrition Screening Tool    ASSESSMENT:   Bridget Huber is a 70 y.o. female with medical history significant of Crohn's disease, hypertension, COPD, remote DVT not on anticoagulants, CKD stage IIIa, anemia, who presents with abdominal pain, nausea, vomiting, diarrhea.  Pt admitted with acute crohn's disease with intestinal obstruction.   Reviewed I/O's: +957 ml x 24 hours  Per GI notes, plan for possible colonoscopy on 03/21/21.  Spoke with pt at bedside, who was pleasant and in good spirits today. She reports that he is hungry and tolerating clear liquids well and thinks she can eat more substantial foods. She consumed 100% of her dinner and breakfast trays.   Per pt, she has had decreased oral intake over the past 3 weeks. Pt shares that she though she was experiencing gas "from eating something I wasn't supposed to", however, this transitioned to severe abdominal pain and inability to keep foods and liquids down. Per pt, she was only able to keep down water and gingerale over the past 2 weeks. At baseline, she has a good appetite and consumes 2 meals per day (Breakfast: cereal, eggs, and breakfast meat; Dinner: meat, starch, and vegetable). She avoids fried foods, spicy foods, and milk secondary to crohn's disease.   Per pt, her UBW is around 162#. She shares that she has lost about 30 pounds  over the past month. No wt hx available to confirm this finding. Upon further questioning, pt shares that her mother who suffered from dementia passed away in January 23, 2021 and she has had a difficult time with this transition (pt describes her as her best friend and was her full time caregiver for 16 years). RD provided comfort and emotional support. Suspect wt loss may be related to grieving process.   Discussed with pt rationale for clear liquid diet. She is amenable to [employer]. Discussed importance of good meal and supplement intake to promote healing.   Medications reviewed and include solu-medrol, calciu with vitamin D, and vitamin D3, and vitamin B-12.   Labs reviewed: CBGS: 103 (inpatient orders for glycemic control are none).   NUTRITION - FOCUSED PHYSICAL EXAM:  Flowsheet Row Most Recent Value  Orbital Region No depletion  Upper Arm Region No depletion  Thoracic and Lumbar Region No depletion  Buccal Region No depletion  Temple Region No depletion  Clavicle Bone Region No depletion  Clavicle and Acromion Bone Region No depletion  Scapular Bone Region No depletion  Dorsal Hand No depletion  Patellar Region No depletion  Anterior Thigh Region No depletion  Posterior Calf Region No depletion  Edema (RD Assessment) None  Hair Reviewed  Eyes Reviewed  Mouth Reviewed  Skin Reviewed  Nails Reviewed       Diet Order:   Diet Order            Diet clear liquid Room service appropriate? Yes; Fluid consistency: Thin  Diet effective now  EDUCATION NEEDS:   Education needs have been addressed  Skin:  Skin Assessment: Reviewed RN Assessment  Last BM:  03/20/21  Height:   Ht Readings from Last 1 Encounters:  03/19/21 5\' 2"  (1.575 m)    Weight:   Wt Readings from Last 1 Encounters:  03/19/21 63.8 kg    Ideal Body Weight:  50 kg  BMI:  Body mass index is 25.73 kg/m.  Estimated Nutritional Needs:   Kcal:  1700-1900  Protein:  85-100  grams  Fluid:  > 1.7 L    Loistine Chance, RD, LDN, [place] Registered Dietitian II Certified Diabetes Care and Education Specialist Please refer to [facility] for RD and/or RD on-call/weekend/after hours pager

## 2021-03-21 ENCOUNTER — Inpatient Hospital Stay: Payer: PPO | Admitting: Anesthesiology

## 2021-03-21 ENCOUNTER — Encounter
Admission: EM | Disposition: A | Discharge status: Home / Self Care | Payer: Self-pay | Source: Home / Self Care | Attending: Internal Medicine

## 2021-03-21 HISTORY — PX: COLONOSCOPY WITH PROPOFOL: SHX5780

## 2021-03-21 LAB — BASIC METABOLIC PANEL
Anion gap: 8 (ref 5–15)
BUN: 13 mg/dL (ref 8–23)
CO2: 20 mmol/L — ABNORMAL LOW (ref 22–32)
Calcium: 9 mg/dL (ref 8.9–10.3)
Chloride: 109 mmol/L (ref 98–111)
Creatinine, Ser: 1.3 mg/dL — ABNORMAL HIGH (ref 0.44–1.00)
GFR, Estimated: 44 mL/min — ABNORMAL LOW (ref >60)
Glucose, Bld: 116 mg/dL — ABNORMAL HIGH (ref 70–99)
Potassium: 4 mmol/L (ref 3.5–5.1)
Sodium: 137 mmol/L (ref 135–145)

## 2021-03-21 LAB — CBC WITH DIFFERENTIAL/PLATELET
Abs Immature Granulocytes: 0.02 10*3/uL (ref 0.00–0.07)
Basophils Absolute: 0 10*3/uL (ref 0.0–0.1)
Basophils Relative: 0 %
Eosinophils Absolute: 0 10*3/uL (ref 0.0–0.5)
Eosinophils Relative: 0 %
HCT: 36 % (ref 36.0–46.0)
Hemoglobin: 12.2 g/dL (ref 12.0–15.0)
Immature Granulocytes: 0 %
Lymphocytes Relative: 21 %
Lymphs Abs: 1 10*3/uL (ref 0.7–4.0)
MCH: 33.2 pg (ref 26.0–34.0)
MCHC: 33.9 g/dL (ref 30.0–36.0)
MCV: 97.8 fL (ref 80.0–100.0)
Monocytes Absolute: 0.3 10*3/uL (ref 0.1–1.0)
Monocytes Relative: 6 %
Neutro Abs: 3.6 10*3/uL (ref 1.7–7.7)
Neutrophils Relative %: 73 %
Platelets: 281 10*3/uL (ref 150–400)
RBC: 3.68 MIL/uL — ABNORMAL LOW (ref 3.87–5.11)
RDW: 14.2 % (ref 11.5–15.5)
WBC: 5 10*3/uL (ref 4.0–10.5)
nRBC: 0.4 % — ABNORMAL HIGH (ref 0.0–0.2)

## 2021-03-21 LAB — MAGNESIUM: Magnesium: 2.2 mg/dL (ref 1.7–2.4)

## 2021-03-21 LAB — GLUCOSE, CAPILLARY: Glucose-Capillary: 93 mg/dL (ref 70–99)

## 2021-03-21 SURGERY — COLONOSCOPY WITH PROPOFOL
Anesthesia: General

## 2021-03-21 MED ORDER — PREDNISONE 20 MG PO TABS
40.0000 mg | ORAL_TABLET | Freq: Every day | ORAL | Status: DC
  Administered 2021-03-22: 40 mg via ORAL
  Filled 2021-03-21: qty 2

## 2021-03-21 MED ORDER — PROPOFOL 10 MG/ML IV BOLUS
INTRAVENOUS | Status: DC | PRN
  Administered 2021-03-21: 60 mg via INTRAVENOUS

## 2021-03-21 MED ORDER — PREDNISONE 10 MG PO TABS: ORAL_TABLET | ORAL | 0 refills | Status: DC

## 2021-03-21 MED ORDER — PHENYLEPHRINE HCL (PRESSORS) 10 MG/ML IV SOLN
INTRAVENOUS | Status: DC | PRN
  Administered 2021-03-21: 50 ug via INTRAVENOUS

## 2021-03-21 MED ORDER — ENSURE ENLIVE PO LIQD
237.0000 mL | Freq: Two times a day (BID) | ORAL | Status: DC
  Administered 2021-03-21 – 2021-03-22 (×2): 237 mL via ORAL

## 2021-03-21 MED ORDER — LIDOCAINE HCL (CARDIAC) PF 100 MG/5ML IV SOSY
PREFILLED_SYRINGE | INTRAVENOUS | Status: DC | PRN
  Administered 2021-03-21: 50 mg via INTRAVENOUS

## 2021-03-21 MED ORDER — SODIUM BICARBONATE 650 MG PO TABS
650.0000 mg | ORAL_TABLET | Freq: Two times a day (BID) | ORAL | Status: DC
  Administered 2021-03-21 – 2021-03-22 (×2): 650 mg via ORAL
  Filled 2021-03-21 (×2): qty 1

## 2021-03-21 MED ORDER — PREDNISOLONE 5 MG PO TABS: 40.0000 mg | ORAL_TABLET | Freq: Every day | ORAL | Status: DC

## 2021-03-21 MED ORDER — PROPOFOL 500 MG/50ML IV EMUL
INTRAVENOUS | Status: AC
  Filled 2021-03-21: qty 50

## 2021-03-21 MED ORDER — PROPOFOL 10 MG/ML IV BOLUS
INTRAVENOUS | Status: AC
  Filled 2021-03-21: qty 20

## 2021-03-21 MED ORDER — SODIUM CHLORIDE 0.9 % IV SOLN: INTRAVENOUS | Status: DC

## 2021-03-21 MED ORDER — PROPOFOL 500 MG/50ML IV EMUL
INTRAVENOUS | Status: DC | PRN
  Administered 2021-03-21: 125 ug/kg/min via INTRAVENOUS

## 2021-03-21 NOTE — Op Note (Signed)
[facility] Patient Name: Bridget Huber Procedure Date: 03/21/2021 12:10 PM MRN: [id number] Account #: [id number] Date of Birth: 1951-05-23 Admit Type: Inpatient Age: 70 Room: [facility] ENDO ROOM 3 Gender: Female Note Status: Finalized Procedure:             Colonoscopy Indications:           Abdominal pain in the right upper quadrant, Follow-up                         of Crohn's disease of the small bowel, Disease                         activity assessment of Crohn's disease of the small                         bowel, Abnormal CT of the GI tract Providers:             Lin Landsman MD, MD Referring MD:          Ocie Cornfield. Ouida Sills MD, MD (Referring MD) Medicines:             General Anesthesia Complications:         No immediate complications. Estimated blood loss: None. Procedure:             Pre-Anesthesia Assessment:                        - Prior to the procedure, a History and Physical was                         performed, and patient medications and allergies were                         reviewed. The patient is competent. The risks and                         benefits of the procedure and the sedation options and                         risks were discussed with the patient. All questions                         were answered and informed consent was obtained.                         Patient identification and proposed procedure were                         verified by the physician, the nurse, the                         anesthesiologist, the anesthetist and the technician                         in the pre-procedure area in the procedure room in the                         endoscopy suite. Mental Status Examination: alert and  oriented. Airway Examination: normal oropharyngeal                         airway and neck mobility. Respiratory Examination:                         clear to auscultation. CV Examination:  normal.                         Prophylactic Antibiotics: The patient does not require                         prophylactic antibiotics. Prior Anticoagulants: The                         patient has taken no previous anticoagulant or                         antiplatelet agents. ASA Grade Assessment: III - A                         patient with severe systemic disease. After reviewing                         the risks and benefits, the patient was deemed in                         satisfactory condition to undergo the procedure. The                         anesthesia plan was to use general anesthesia.                         Immediately prior to administration of medications,                         the patient was re-assessed for adequacy to receive                         sedatives. The heart rate, respiratory rate, oxygen                         saturations, blood pressure, adequacy of pulmonary                         ventilation, and response to care were monitored                         throughout the procedure. The physical status of the                         patient was re-assessed after the procedure.                        After obtaining informed consent, the colonoscope was                         passed under direct vision. Throughout the procedure,  the patient's blood pressure, pulse, and oxygen                         saturations were monitored continuously. The                         Colonoscope was introduced through the anus and                         advanced to the the ileocolonic anastomosis. The                         colonoscopy was performed without difficulty. The                         patient tolerated the procedure well. The quality of                         the bowel preparation was fair. Findings:      The perianal and digital rectal examinations were normal. Pertinent       negatives include normal sphincter tone and no  palpable rectal lesions.      There was evidence of a prior end-to-side ileo-colonic anastomosis in       the ascending colon. This was non-patent and was characterized by       friable mucosa, inflammation, severe stenosis and ulceration. The       anastomosis could not be traversed. A TTS dilator was passed through the       scope. Dilation with an 07-02-09 mm colonic balloon dilator was performed.       The dilation site was examined following endoscope reinsertion and       showed mild improvement in luminal narrowing. Estimated blood loss was       minimal. Biopsies were taken with a cold forceps for histology.      Normal mucosa was found in the entire colon. Biopsies were taken with a       cold forceps for histology.      The retroflexed view of the distal rectum and anal verge was normal and       showed no anal or rectal abnormalities.      Non-bleeding external hemorrhoids were found during retroflexion. The       hemorrhoids were medium-sized. Impression:            - Preparation of the colon was fair.                        - Non-patent end-to-side ileo-colonic anastomosis,                         characterized by friable mucosa, inflammation,                         ulceration and severe stenosis. Dilated. Biopsied.                        - Normal mucosa in the entire examined colon. Biopsied.                        - The distal rectum and anal verge are normal on  retroflexion view.                        - Non-bleeding external hemorrhoids. Recommendation:        - Return patient to hospital ward for ongoing care.                        - Advance diet as tolerated today.                        - Continue present medications.                        - Await pathology results.                        - Return to GI clinic as previously scheduled.                        - Switch to prednisone 40mg  daily                        - Discuss about switching to  anti-TNF or stelara as                         outpt since patient has post op recurrence of Crohn's                         on imuran Procedure Code(s):     --- Professional ---                        [phone], Colonoscopy, flexible; with transendoscopic                         balloon dilation                        45380, Colonoscopy, flexible; with biopsy, single or                         multiple Diagnosis Code(s):     --- Professional ---                        K64.4, Residual hemorrhoidal skin tags                        K91.89, Other postprocedural complications and                         disorders of digestive system                        R10.11, Right upper quadrant pain                        K50.00, Crohn's disease of small intestine without                         complications                        R93.3, Abnormal  findings on diagnostic imaging of                         other parts of digestive tract CPT copyright 2019 American [employer] Association. All rights reserved. The codes documented in this report are preliminary and upon coder review may  be revised to meet current compliance requirements. Dr. Ulyess Mort Lin Landsman MD, MD 03/21/2021 1:29:19 PM This report has been signed electronically. Number of Addenda: 0 Note Initiated On: 03/21/2021 12:10 PM Scope Withdrawal Time: 0 hours 57 minutes 10 seconds  Total Procedure Duration: 1 hour 1 minute 16 seconds  Estimated Blood Loss:  Estimated blood loss: none.      [facility]

## 2021-03-21 NOTE — Transfer of Care (Signed)
Immediate Anesthesia Transfer of Care Note  Patient: Bridget Huber  Procedure(s) Performed: COLONOSCOPY WITH PROPOFOL (N/A )  Patient Location: PACU  Anesthesia Type:General  Level of Consciousness: sedated  Airway & Oxygen Therapy: Patient Spontanous Breathing  Post-op Assessment: Report given to RN and Post -op Vital signs reviewed and stable  Post vital signs: Reviewed and stable  Last Vitals:  Vitals Value Taken Time  BP    Temp    Pulse 71 03/21/21 1325  Resp 16 03/21/21 1325  SpO2 100 % 03/21/21 1325  Vitals shown include unvalidated device data.  Last Pain:  Vitals:   03/21/21 1102  TempSrc: Temporal  PainSc: 0-No pain         Complications: No complications documented.

## 2021-03-21 NOTE — Progress Notes (Signed)
PROGRESS NOTE    Bridget Huber  WUJ:[id number] DOB: 08-09-1951 DOA: 03/19/2021 PCP: Kirk Ruths, MD    Chief Complaint  Patient presents with  . Abdominal Pain    Brief Narrative:   Bridget Huber is a 70 y.o. female with medical history significant of Crohn's disease, hypertension, COPD, remote DVT not on anticoagulants, CKD stage IIIa, anemia, who presents with abdominal pain, nausea, vomiting, diarrhea. Admitted for acute Crohn's flareup and SBO  Subjective:  Bridget Huber is seen after returning from colonoscopy, denies abdominal pain, no nausea no vomiting, tolerated soft diet  Bridget Huber does not want to go home today Family at bedside   Assessment & Plan:   Principal Problem:   Acute Crohn's disease with intestinal obstruction ([place]) Active Problems:   COPD (chronic obstructive pulmonary disease) ([place])   Hypertension   Acute renal failure superimposed on stage 3a chronic kidney disease (HCC)   Hypokalemia   Tobacco abuse   Iron deficiency anemia   Acute Crohn's flareup with SBO -Bridget Huber received Solu-Medrol 60 mg IV daily, Imuran 100 mg daily -s/p  Colonoscopy on 4/28  "Non-patent end-to-side ileo-colonic anastomosis,                         characterized by friable mucosa, inflammation,                         ulceration and severe stenosis. Dilated. Biopsied.' _ GI Dr Marius Ditch input appreciated, Bridget Huber recommend continue imuran, start "prednisone 40 mg daily for 2 weeks followed by weekly taper by 10 mg until finished" -Follow-up with [facility]   -  Acute renal failure superimposed on stage 3a chronic kidney disease (HCC)/metabolic acidosis:  -Baseline creatinine 1.1 on 11/27/2020.   -Her creatinine is 1.62, BUN 22.  On presentation,  Most likely due to dehydration -IV fluid as above, bun/cr improving, bicarb improving, renal dosing meds -Encourage oral intake  Hypokalemia/hypomagnesemia:  Replaced and normalized  Iron deficiency anemia: Hemoglobin stable  13.0 -Continue iron supplement  COPD (chronic obstructive pulmonary disease) ([place]): Stable -As needed albuterol  Tobacco abuse -Nicotine patch    Nutritional Assessment: The patient's BMI is: Body mass index is 25.73 kg/m.Marland Kitchen Seen by dietician.  I agree with the assessment and plan as outlined below: Nutrition Status: Nutrition Problem: Inadequate oral intake Etiology: altered GI function Signs/Symptoms: per patient/family report Interventions: Boost Breeze,MVI  .    Unresulted Labs (From admission, onward)         None        DVT prophylaxis: heparin injection 5,000 Units Start: 03/19/21 2200   Code Status: Full Family Communication: Family at bedside Disposition:   Status is: Inpatient  Dispo: The patient is from: Home              Anticipated d/c is to: Home              Anticipated d/c date is: Home tomorrow              Consultants:   GI  Procedures:   Colonoscopy  Antimicrobials:   None     Objective: Vitals:   03/21/21 1326 03/21/21 1336 03/21/21 1346 03/21/21 1420  BP: (!) 106/59 94/69 123/64 139/72  Pulse:    (!) 58  Resp:    18  Temp: (!) 96.9 F (36.1 C)   (!) 97.3 F (36.3 C)  TempSrc: Temporal   Oral  SpO2:  97%  Weight:      Height:        Intake/Output Summary (Last 24 hours) at 03/21/2021 1652 Last data filed at 03/21/2021 0622 Gross per 24 hour  Intake 780 ml  Output --  Net 780 ml   Filed Weights   03/19/21 1120 03/19/21 1950  Weight: 63.5 kg 63.8 kg    Examination:  General exam: calm, NAD Respiratory system: Clear to auscultation. Respiratory effort normal. Cardiovascular system: S1 & S2 heard, RRR. No JVD, no murmur, No pedal edema. Gastrointestinal system: Abdomen is nondistended, soft , nontender, no gaurding, no rebound . Normal bowel sounds heard. Central nervous system: Alert and oriented. No focal neurological deficits. Extremities: Symmetric 5 x 5 power. Skin: No rashes, lesions or  ulcers Psychiatry: Judgement and insight appear normal. Mood & affect appropriate.     Data Reviewed: I have personally reviewed following labs and imaging studies  CBC: Recent Labs  Lab 03/19/21 1121 03/20/21 0618 03/21/21 0329  WBC 5.7 4.8 5.0  NEUTROABS  --   --  3.6  HGB 13.0 11.7* 12.2  HCT 37.8 33.5* 36.0  MCV 95.7 96.3 97.8  PLT 305 267 144    Basic Metabolic Panel: Recent Labs  Lab 03/19/21 1121 03/20/21 0618 03/21/21 0329  NA 136 138 137  K 3.0* 4.2 4.0  CL 108 110 109  CO2 17* 20* 20*  GLUCOSE 117* 107* 116*  BUN 22 17 13   CREATININE 1.62* 1.33* 1.30*  CALCIUM 8.9 8.5* 9.0  MG 1.5*  --  2.2    GFR: Estimated Creatinine Clearance: 34.8 mL/min (A) (by C-G formula based on SCr of 1.3 mg/dL (H)).  Liver Function Tests: Recent Labs  Lab 03/19/21 1121  AST 15  ALT 12  ALKPHOS 83  BILITOT 0.8  PROT 7.0  ALBUMIN 3.8    CBG: Recent Labs  Lab 03/20/21 0741 03/21/21 0751  GLUCAP 103* 93     Recent Results (from the past 240 hour(s))  C Difficile Quick Screen w PCR reflex     Status: None   Collection Time: 03/19/21  5:00 AM   Specimen: STOOL  Result Value Ref Range Status   C Diff antigen NEGATIVE NEGATIVE Final   C Diff toxin NEGATIVE NEGATIVE Final   C Diff interpretation No C. difficile detected.  Final    Comment: Performed at [facility], [place], [address] [place], [place] [address]  Resp Panel by RT-PCR (Flu A&B, Covid) Nasopharyngeal Swab     Status: None   Collection Time: 03/19/21  4:11 PM   Specimen: Nasopharyngeal Swab; Nasopharyngeal(NP) swabs in vial transport medium  Result Value Ref Range Status   SARS Coronavirus 2 by RT PCR NEGATIVE NEGATIVE Final    Comment: (NOTE) SARS-CoV-2 target nucleic acids are NOT DETECTED.  The SARS-CoV-2 RNA is generally detectable in upper respiratory specimens during the acute phase of infection. The lowest concentration of SARS-CoV-2 viral copies this assay can detect is 138  copies/mL. A negative result does not preclude SARS-Cov-2 infection and should not be used as the sole basis for treatment or other patient management decisions. A negative result may occur with  improper specimen collection/handling, submission of specimen other than nasopharyngeal swab, presence of viral mutation(s) within the areas targeted by this assay, and inadequate number of viral copies(<138 copies/mL). A negative result must be combined with clinical observations, patient history, and epidemiological information. The expected result is Negative.  Fact Sheet for Patients:  EntrepreneurPulse.com.au  Fact Sheet  for Healthcare Providers:  IncredibleEmployment.be  This test is no t yet approved or cleared by the [place] and  has been authorized for detection and/or diagnosis of SARS-CoV-2 by FDA under an Emergency Use Authorization (EUA). This EUA will remain  in effect (meaning this test can be used) for the duration of the COVID-19 declaration under Section 564(b)(1) of the Act, 21 U.S.C.section 360bbb-3(b)(1), unless the authorization is terminated  or revoked sooner.       Influenza A by PCR NEGATIVE NEGATIVE Final   Influenza B by PCR NEGATIVE NEGATIVE Final    Comment: (NOTE) The Xpert Xpress SARS-CoV-2/FLU/RSV plus assay is intended as an aid in the diagnosis of influenza from Nasopharyngeal swab specimens and should not be used as a sole basis for treatment. Nasal washings and aspirates are unacceptable for Xpert Xpress SARS-CoV-2/FLU/RSV testing.  Fact Sheet for Patients: EntrepreneurPulse.com.au  Fact Sheet for Healthcare Providers: IncredibleEmployment.be  This test is not yet approved or cleared by the [place] FDA and has been authorized for detection and/or diagnosis of SARS-CoV-2 by FDA under an Emergency Use Authorization (EUA). This EUA will remain in effect (meaning  this test can be used) for the duration of the COVID-19 declaration under Section 564(b)(1) of the Act, 21 U.S.C. section 360bbb-3(b)(1), unless the authorization is terminated or revoked.  Performed at [facility], [address], [place], [place] [address]          Radiology Studies: No results found.      Scheduled Meds: . azaTHIOprine  100 mg Oral Daily  . calcium-vitamin D  1 tablet Oral BID  . cholecalciferol  2,000 Units Oral Daily  . feeding supplement  237 mL Oral BID BM  . heparin  5,000 Units Subcutaneous Q8H  . multivitamin with minerals  1 tablet Oral Daily  . nicotine  21 mg Transdermal Daily  . pantoprazole  40 mg Oral Daily  . [START ON 03/22/2021] predniSONE  40 mg Oral Q breakfast  . cyanocobalamin  1,000 mcg Oral Daily   Continuous Infusions:    LOS: 2 days   Time spent: 27mins Greater than 50% of this time was spent in counseling, explanation of diagnosis, planning of further management, and coordination of care.   Voice Recognition Viviann Spare dictation system was used to create this note, attempts have been made to correct errors. Please contact the author with questions and/or clarifications.   Florencia Reasons, MD PhD FACP [facility]  Available via Epic secure chat 7am-7pm for nonurgent issues Please page for urgent issues To page the attending provider between 7A-7P or the covering provider during after hours 7P-7A, please log into the web site [email or web address] and access using universal [place] password for that web site. If you do not have the password, please call the hospital operator.    03/21/2021, 4:52 PM

## 2021-03-21 NOTE — Plan of Care (Signed)

## 2021-03-21 NOTE — Anesthesia Postprocedure Evaluation (Signed)
Anesthesia Post Note  Patient: Bridget Huber  Procedure(s) Performed: COLONOSCOPY WITH PROPOFOL (N/A )  Patient location during evaluation: PACU Anesthesia Type: General Level of consciousness: awake and alert Pain management: pain level controlled Vital Signs Assessment: post-procedure vital signs reviewed and stable Respiratory status: spontaneous breathing, nonlabored ventilation and respiratory function stable Cardiovascular status: blood pressure returned to baseline and stable Postop Assessment: no apparent nausea or vomiting Anesthetic complications: no   No complications documented.   Last Vitals:  Vitals:   03/21/21 1346 03/21/21 1420  BP: 123/64 139/72  Pulse:  (!) 58  Resp:  18  Temp:  (!) 36.3 C  SpO2:  97%    Last Pain:  Vitals:   03/21/21 1420  TempSrc: Oral  PainSc:                  Tera Mater

## 2021-03-21 NOTE — Anesthesia Preprocedure Evaluation (Addendum)
Anesthesia Evaluation  Patient identified by MRN, date of birth, ID band Patient awake    Reviewed: Allergy & Precautions, H&P , NPO status , Patient's Chart, lab work & pertinent test results  History of Anesthesia Complications (+) history of anesthetic complications ("tired for two days after propofol")  Airway Mallampati: II  TM Distance: >3 FB     Dental  (+) Edentulous Upper   Pulmonary neg sleep apnea, COPD, Current Smoker and Patient abstained from smoking.,    breath sounds clear to auscultation       Cardiovascular hypertension, (-) angina(-) Past MI and (-) Cardiac Stents (-) dysrhythmias  Rhythm:regular Rate:Normal     Neuro/Psych negative neurological ROS  negative psych ROS   GI/Hepatic negative GI ROS, Neg liver ROS, IBD   Endo/Other  negative endocrine ROS  Renal/GU Renal disease (CKD)  negative genitourinary   Musculoskeletal   Abdominal   Peds  Hematology negative hematology ROS (+)   Anesthesia Other Findings Partial SBO listed in hospital diagnoses; however not being treated clinically for SBO; no NGT; no N/V in past 3 days; completed bowel prep without difficulty; no abd distension  Past Medical History: No date: Anemia No date: Arthritis No date: Chronic kidney disease     Comment:  kidney stones No date: COPD (chronic obstructive pulmonary disease) (HCC) No date: Crohn's disease ([place]) No date: DVT (deep venous thrombosis) ([place]) No date: Hypertension  Past Surgical History: 05/31/2015: COLONOSCOPY; N/A     Comment:  Procedure: COLONOSCOPY;  Surgeon: Manya Silvas, MD;               Location: [facility] ENDOSCOPY;  Service: Endoscopy;                Laterality: N/A;  BMI    Body Mass Index: 25.73 kg/m      Reproductive/Obstetrics negative OB ROS                            Anesthesia Physical Anesthesia Plan  ASA: III  Anesthesia Plan: General   Post-op  Pain Management:    Induction:   PONV Risk Score and Plan: Propofol infusion and TIVA  Airway Management Planned: Simple Face Mask  Additional Equipment:   Intra-op Plan:   Post-operative Plan:   Informed Consent: I have reviewed the patients History and Physical, chart, labs and discussed the procedure including the risks, benefits and alternatives for the proposed anesthesia with the patient or authorized representative who has indicated his/her understanding and acceptance.     Dental Advisory Given  Plan Discussed with: Anesthesiologist, CRNA and Surgeon  Anesthesia Plan Comments:         Anesthesia Quick Evaluation

## 2021-03-21 NOTE — Anesthesia Procedure Notes (Signed)
Procedure Name: MAC Date/Time: 03/21/2021 12:16 PM Performed by: Jerrye Noble, CRNA Pre-anesthesia Checklist: Patient identified, Emergency Drugs available, Suction available and Patient being monitored Patient Re-evaluated:Patient Re-evaluated prior to induction Oxygen Delivery Method: Nasal cannula

## 2021-03-22 ENCOUNTER — Encounter: Payer: Self-pay | Admitting: Gastroenterology

## 2021-03-22 ENCOUNTER — Telehealth: Payer: Self-pay

## 2021-03-22 LAB — CBC WITH DIFFERENTIAL/PLATELET
Abs Immature Granulocytes: 0.02 10*3/uL (ref 0.00–0.07)
Basophils Absolute: 0 10*3/uL (ref 0.0–0.1)
Basophils Relative: 0 %
Eosinophils Absolute: 0 10*3/uL (ref 0.0–0.5)
Eosinophils Relative: 0 %
HCT: 32.3 % — ABNORMAL LOW (ref 36.0–46.0)
Hemoglobin: 11.2 g/dL — ABNORMAL LOW (ref 12.0–15.0)
Immature Granulocytes: 0 %
Lymphocytes Relative: 22 %
Lymphs Abs: 1 10*3/uL (ref 0.7–4.0)
MCH: 33.6 pg (ref 26.0–34.0)
MCHC: 34.7 g/dL (ref 30.0–36.0)
MCV: 97 fL (ref 80.0–100.0)
Monocytes Absolute: 0.2 10*3/uL (ref 0.1–1.0)
Monocytes Relative: 5 %
Neutro Abs: 3.2 10*3/uL (ref 1.7–7.7)
Neutrophils Relative %: 73 %
Platelets: 259 10*3/uL (ref 150–400)
RBC: 3.33 MIL/uL — ABNORMAL LOW (ref 3.87–5.11)
RDW: 14.2 % (ref 11.5–15.5)
WBC: 4.5 10*3/uL (ref 4.0–10.5)
nRBC: 0.4 % — ABNORMAL HIGH (ref 0.0–0.2)

## 2021-03-22 LAB — BASIC METABOLIC PANEL
Anion gap: 7 (ref 5–15)
BUN: 13 mg/dL (ref 8–23)
CO2: 21 mmol/L — ABNORMAL LOW (ref 22–32)
Calcium: 8.8 mg/dL — ABNORMAL LOW (ref 8.9–10.3)
Chloride: 111 mmol/L (ref 98–111)
Creatinine, Ser: 1.3 mg/dL — ABNORMAL HIGH (ref 0.44–1.00)
GFR, Estimated: 44 mL/min — ABNORMAL LOW (ref >60)
Glucose, Bld: 100 mg/dL — ABNORMAL HIGH (ref 70–99)
Potassium: 3.7 mmol/L (ref 3.5–5.1)
Sodium: 139 mmol/L (ref 135–145)

## 2021-03-22 LAB — MAGNESIUM: Magnesium: 1.8 mg/dL (ref 1.7–2.4)

## 2021-03-22 LAB — GLUCOSE, CAPILLARY: Glucose-Capillary: 88 mg/dL (ref 70–99)

## 2021-03-22 NOTE — Care Management Important Message (Signed)
Important Message  Patient Details  Name: Bridget Huber MRN: [id number] Date of Birth: 18-Nov-1951   Medicare Important Message Given:  Yes     Dannette Barbara 03/22/2021, 10:49 AM

## 2021-03-22 NOTE — Telephone Encounter (Signed)
The Next available appointment is fine for hospital follow up. You can do may 25 in [place] at 1:15 or May 27 in [place]

## 2021-03-22 NOTE — Discharge Summary (Signed)
Discharge Summary  Bridget Huber [id number] DOB: 04/05/1951  PCP: Kirk Ruths, MD  Admit date: 03/19/2021 Discharge date: 03/22/2021  Time spent:  67mins  Recommendations for Outpatient Follow-up:  1. F/u with PCP within a week  for hospital discharge follow up, repeat cbc/bmp at follow up 2. F/u with [facility]   Discharge Diagnoses:  Active Hospital Problems   Diagnosis Date Noted  . Acute Crohn's disease with intestinal obstruction ([place]) 03/19/2021  . Tobacco abuse 03/19/2021  . Iron deficiency anemia 03/19/2021  . COPD (chronic obstructive pulmonary disease) ([place])   . Hypertension   . Acute renal failure superimposed on stage 3a chronic kidney disease ([place])   . Hypokalemia     Resolved Hospital Problems  No resolved problems to display.    Discharge Condition: stable  Diet recommendation: Regular diet  Filed Weights   03/19/21 1120 03/19/21 1950  Weight: 63.5 kg 63.8 kg    History of present illness: (Per admitting MD Dr. Blaine Hamper) Chief Complaint: Abdominal pain, nausea, vomiting, diarrhea  HPI: Bridget Huber is a 70 y.o. female with medical history significant of Crohn's disease, hypertension, COPD, remote DVT not on anticoagulants, CKD stage IIIa, anemia, who presents with abdominal pain, nausea, vomiting, diarrhea.  Patient states that she has been having abdominal pain for more than 1 week.  The abdominal pain is located in the middle and lower abdomen on both sides, constant, initially severe, currently mild, sharp, radiating to the back.  Associate with nausea and multiple episodes of nonbilious nonbloody vomiting each day, and diarrhea.  She has 1-3 episodes of diarrhea each day. No fever or chills. Denies chest pain, cough, shortness of breath.  No symptoms of UTI.  No unilateral weakness.  ED Course: pt was found to have WBC 5.7, pending COVID-19 PCR, worsening renal function, potassium 3.0, temperature 99, blood pressure 138/109, heart rate  100, 87, RR 18, oxygen saturation 92% on room air.  Patient is admitted to [place] bed as inpatient. Dr. Marius Ditch of GI is consulted.   Hospital Course:  Principal Problem:   Acute Crohn's disease with intestinal obstruction ([place]) Active Problems:   COPD (chronic obstructive pulmonary disease) (H[facility])   Hypertension   Acute renal failure superimposed on stage 3a chronic kidney disease (H[facility])   Hypokalemia   Tobacco abuse   Iron deficiency anemia  Acute Crohn's flareup with SBO -she received Solu-Medrol 60 mg IV daily, Imuran 100 mg daily -s/p  Colonoscopy on 4/28  "Non-patent end-to-side ileo-colonic anastomosis,  characterized by friable mucosa, inflammation,  ulceration and severe stenosis. Dilated. Biopsied.' _ GI Dr Marius Ditch input appreciated, she recommend continue imuran, start "prednisone 40 mg daily for 2 weeks followed by weekly taper by 10 mg until finished" -[facility]-up [facility]   -  Acute renal failure superimposed on stage 3a chronic kidney disease (HCC)/metabolic acidosis: -Baseline creatinine 1.1 on 11/27/2020.  -Her creatinine is 1.62, BUN 22.  On presentation, Most likely due to dehydration -she received IV fluid and bicarb supplement, bun/cr improving, bicarb improving, renal dosing meds -Encourage oral intake -f/u with pcp  Hypokalemia/hypomagnesemia:  K3, mag 1.5 on presentation, likely due to poor oral intake Replaced and normalized  Iron deficiency anemia:Hemoglobin stable 13.0 -Continue iron supplement  COPD (chronic obstructive pulmonary disease) ([place]): Stable -As needed albuterol  Tobacco abuse -Nicotine patch    Nutritional Assessment: The patient's BMI is: Body mass index is 25.73 kg/m.Marland Kitchen Seen by dietician.  I agree with the assessment and plan as outlined  below: Nutrition Status: Nutrition Problem: Inadequate oral intake Etiology: altered GI function Signs/Symptoms: per  patient/family report Interventions: Boost Breeze,MVI  .   DVT prophylaxis while in the hospital: heparin injection 5,000 Units Start: 03/19/21 2200   Code Status: Full Family Communication: Family at bedside on 4/27 and 4/28 Disposition: home   Consultants:   GI Dr Marius Ditch  Procedures:   Colonoscopy on 4/28  Antimicrobials:   None  Discharge Exam: BP 125/69 (BP Location: Left Arm)   Pulse 64   Temp 98.1 F (36.7 C) (Oral)   Resp 18   Ht 5\' 2"  (1.575 m)   Wt 63.8 kg   SpO2 100%   BMI 25.73 kg/m   General: NAD, pleasant Cardiovascular: RRR Respiratory: Normal respiratory effort Abdomen: Soft, nontender, nondistended, positive bowel sounds  Discharge Instructions You were cared for by a hospitalist during your hospital stay. If you have any questions about your discharge medications or the care you received while you were in the hospital after you are discharged, you can call the unit and asked to speak with the hospitalist on call if the hospitalist that took care of you is not available. Once you are discharged, your primary care physician will handle any further medical issues. Please note that NO REFILLS for any discharge medications will be authorized once you are discharged, as it is imperative that you return to your primary care physician (or establish a relationship with a primary care physician if you do not have one) for your aftercare needs so that they can reassess your need for medications and monitor your lab values.  Discharge Instructions    Diet general   Complete by: As directed    Increase activity slowly   Complete by: As directed      Allergies as of 03/22/2021      Reactions   Aspirin       Medication List    TAKE these medications   acetaminophen 650 MG CR tablet Commonly known as: TYLENOL Take 1,300 mg by mouth every 8 (eight) hours as needed for pain.   azaTHIOprine 50 MG tablet Commonly known as: IMURAN Take 100 mg  by mouth daily.   Calcium Carbonate-Vitamin D 600-400 MG-UNIT tablet Take 1 tablet by mouth 2 (two) times daily.   Cholecalciferol 50 MCG (2000 UT) Caps Take 2,000 Units by mouth daily.   cyanocobalamin 1000 MCG tablet Take 1,000 mcg by mouth daily.   ferrous sulfate 325 (65 FE) MG tablet Take 325 mg by mouth daily.   Multi-Vitamin tablet Take 1 tablet by mouth daily.   omeprazole 20 MG capsule Commonly known as: PRILOSEC Take 20 mg by mouth daily.   predniSONE 10 MG tablet Commonly known as: DELTASONE Take 40mg  once daily with breakfast for two weeks, then 30 mg daily with breakfast  for 1 week, then 20 mg daily with breakfast for 1 week ,then 10 mg daily with breakfast for 1 week, then 5 mg daily with breakfast for 1 week ,then stop      Allergies  Allergen Reactions  . Aspirin     Follow-up Information    Kirk Ruths, MD. Go on 03/28/2021.   Specialty: Internal Medicine Why:  1:15pm appointment Contact information: [place] [address] [phone]        Go to [facility] [place].   Why: f/u with [facility] in two weeks (they will contact her) Contact information: [address]  [place] [address] [phone]        Lin Landsman, MD.   Specialty: Gastroenterology Why: f/u with biopsy result ( Contact information: [place] [address] [phone]                The results of significant diagnostics from this hospitalization (including imaging, microbiology, ancillary and laboratory) are listed below for reference.    Significant Diagnostic Studies: CT ABDOMEN PELVIS W CONTRAST  Result Date: 03/19/2021 CLINICAL DATA:  Bowel obstruction suspected, history of inflammatory bowel disease with abdominal pain EXAM: CT ABDOMEN AND PELVIS WITH CONTRAST TECHNIQUE: Multidetector CT imaging of the abdomen and pelvis was performed using the standard protocol  following bolus administration of intravenous contrast. CONTRAST:  67mL OMNIPAQUE IOHEXOL 300 MG/ML  SOLN COMPARISON:  Prior studies from 2009. FINDINGS: Lower chest: Incidental imaging of the lung bases without effusion or consolidation. Hepatobiliary: Post cholecystectomy with mild biliary duct distension this is increased since 2009 particularly with respect intrahepatic ducts with there is moderate intrahepatic biliary duct dilation of LEFT and RIGHT hepatic ducts. No focal, suspicious hepatic lesion. The portal vein is patent. Pancreas: Normal, without mass, inflammation or ductal dilatation. Spleen: Normal Adrenals/Urinary Tract: Under distended stomach limiting assessment. Question gastric thickening though similar appearance seen on previous imaging. Post ileocecal resection with long segment narrowing and mural stratification of the neoterminal ileum on image 41 of series 2 this measures approximately 3.6 cm greatest length with 4.2 cm upstream bowel dilation in the distal ileum. Signs of colonic thickening of the transverse colon with mural stratification. No colonic dilation. Bowel loops become gradually less distended with passing from distal to proximal. Jejunal loops are decompressed without signs of inflammation. There is evidence of open "creeping fat" about the affected segment of small bowel in the RIGHT lower quadrant. Vascular/Lymphatic: Portal vein again is patent. The IVC is smooth and normal caliber. No aneurysmal dilation of the abdominal aorta. Calcified and noncalcified plaque in the abdominal aorta. There is no gastrohepatic or hepatoduodenal ligament lymphadenopathy. No retroperitoneal or mesenteric lymphadenopathy. Scattered small lymph nodes in the involved mesentery in the RIGHT lower quadrant. No pelvic sidewall lymphadenopathy. Reproductive: Unremarkable Other: No ascites. No abscess. Small fistula from the anastomotic site 2 adjacent small bowel best seen on image 45 of series 5  also seen on sagittal images. Is unclear whether this is an active fistula or a tract from previous fistulization but there is bridging between these 2 structures at the site of presumed stricture. Musculoskeletal: No acute bone finding or destructive bone process. No signs of avascular necrosis of the femoral heads. Spinal degenerative changes. IMPRESSION: 1. Stigmata of suspected acute on chronic Crohn's disease with stricture and potential fistula associated with bowel obstruction though more proximal loops of small bowel are decompressed. 2. Bowel dilation may have a chronic component though is worse than on very remote studies that are available for comparison from 2009. Severity of obstruction may be exacerbated by an overlay of acute on chronic inflammatory bowel disease. GI consultation is suggested. 3. Suspected skip lesions in the colon with signs of colonic Crohn's disease. 4. Increasing biliary duct dilation with intrahepatic biliary duct distension in this patient with history of inflammatory bowel disease remains nonspecific following cholecystectomy but should be correlated with laboratory values to determine whether MRCP may be helpful and there is any clinical concern for [place]. 5. Aortic atherosclerosis. Electronically Signed   By: Zetta Bills M.D.   On: 03/19/2021 15:33  Microbiology: Recent Results (from the past 240 hour(s))  C Difficile Quick Screen w PCR reflex     Status: None   Collection Time: 03/19/21  5:00 AM   Specimen: STOOL  Result Value Ref Range Status   C Diff antigen NEGATIVE NEGATIVE Final   C Diff toxin NEGATIVE NEGATIVE Final   C Diff interpretation No C. difficile detected.  Final    Comment: Performed at [facility], [address] [place], [place] [address]  Resp Panel by RT-PCR (Flu A&B, Covid) Nasopharyngeal Swab     Status: None   Collection Time: 03/19/21  4:11 PM   Specimen: Nasopharyngeal Swab; Nasopharyngeal(NP) swabs in vial transport  medium  Result Value Ref Range Status   SARS Coronavirus 2 by RT PCR NEGATIVE NEGATIVE Final    Comment: (NOTE) SARS-CoV-2 target nucleic acids are NOT DETECTED.  The SARS-CoV-2 RNA is generally detectable in upper respiratory specimens during the acute phase of infection. The lowest concentration of SARS-CoV-2 viral copies this assay can detect is 138 copies/mL. A negative result does not preclude SARS-Cov-2 infection and should not be used as the sole basis for treatment or other patient management decisions. A negative result may occur with  improper specimen collection/handling, submission of specimen other than nasopharyngeal swab, presence of viral mutation(s) within the areas targeted by this assay, and inadequate number of viral copies(<138 copies/mL). A negative result must be combined with clinical observations, patient history, and epidemiological information. The expected result is Negative.  Fact Sheet for Patients:  EntrepreneurPulse.com.au  Fact Sheet for Healthcare Providers:  IncredibleEmployment.be  This test is no t yet approved or cleared by the [place] FDA and  has been authorized for detection and/or diagnosis of SARS-CoV-2 by FDA under an Emergency Use Authorization (EUA). This EUA will remain  in effect (meaning this test can be used) for the duration of the COVID-19 declaration under Section 564(b)(1) of the Act, 21 U.S.C.section 360bbb-3(b)(1), unless the authorization is terminated  or revoked sooner.       Influenza A by PCR NEGATIVE NEGATIVE Final   Influenza B by PCR NEGATIVE NEGATIVE Final    Comment: (NOTE) The Xpert Xpress SARS-CoV-2/FLU/RSV plus assay is intended as an aid in the diagnosis of influenza from Nasopharyngeal swab specimens and should not be used as a sole basis for treatment. Nasal washings and aspirates are unacceptable for Xpert Xpress SARS-CoV-2/FLU/RSV testing.  Fact Sheet for  Patients: EntrepreneurPulse.com.au  Fact Sheet for Healthcare Providers: IncredibleEmployment.be  This test is not yet approved or cleared by the [place] FDA and has been authorized for detection and/or diagnosis of SARS-CoV-2 by FDA under an Emergency Use Authorization (EUA). This EUA will remain in effect (meaning this test can be used) for the duration of the COVID-19 declaration under Section 564(b)(1) of the Act, 21 U.S.C. section 360bbb-3(b)(1), unless the authorization is terminated or revoked.  Performed at [facility], [address] [place], [place] [address]      Labs: Basic Metabolic Panel: Recent Labs  Lab 03/19/21 1121 03/20/21 0618 03/21/21 0329 03/22/21 0407  NA 136 138 137 139  K 3.0* 4.2 4.0 3.7  CL 108 110 109 111  CO2 17* 20* 20* 21*  GLUCOSE 117* 107* 116* 100*  BUN 22 17 13 13   CREATININE 1.62* 1.33* 1.30* 1.30*  CALCIUM 8.9 8.5* 9.0 8.8*  MG 1.5*  --  2.2 1.8   Liver Function Tests: Recent Labs  Lab 03/19/21 1121  AST 15  ALT 12  ALKPHOS 83  BILITOT 0.8  PROT 7.0  ALBUMIN 3.8   Recent Labs  Lab 03/19/21 1121  LIPASE 26   No results for input(s): AMMONIA in the last 168 hours. CBC: Recent Labs  Lab 03/19/21 1121 03/20/21 0618 03/21/21 0329 03/22/21 0407  WBC 5.7 4.8 5.0 4.5  NEUTROABS  --   --  3.6 3.2  HGB 13.0 11.7* 12.2 11.2*  HCT 37.8 33.5* 36.0 32.3*  MCV 95.7 96.3 97.8 97.0  PLT 305 267 281 259   Cardiac Enzymes: No results for input(s): CKTOTAL, CKMB, CKMBINDEX, TROPONINI in the last 168 hours. BNP: BNP (last 3 results) No results for input(s): BNP in the last 8760 hours.  ProBNP (last 3 results) No results for input(s): PROBNP in the last 8760 hours.  CBG: Recent Labs  Lab 03/20/21 0741 03/21/21 0751 03/22/21 0734  GLUCAP 103* 93 88       Signed:  Florencia Reasons MD, PhD, FACP  [facility] 03/22/2021, 3:08 PM

## 2021-03-22 NOTE — Plan of Care (Signed)
  Problem: Education: Goal: Knowledge of General Education information will improve Description: Including pain rating scale, medication(s)/side effects and non-pharmacologic comfort measures 03/22/2021 1019 by Vivien Rota, RN Outcome: Adequate for Discharge 03/22/2021 0759 by Vivien Rota, RN Outcome: Progressing   Problem: Health Behavior/Discharge Planning: Goal: Ability to manage health-related needs will improve 03/22/2021 1019 by Ryleigh Buenger, Winifred Olive, RN Outcome: Adequate for Discharge 03/22/2021 0759 by Vivien Rota, RN Outcome: Progressing   Problem: Clinical Measurements: Goal: Ability to maintain clinical measurements within normal limits will improve 03/22/2021 1019 by Vivien Rota, RN Outcome: Adequate for Discharge 03/22/2021 0759 by Vivien Rota, RN Outcome: Progressing Goal: Will remain free from infection 03/22/2021 1019 by Vivien Rota, RN Outcome: Adequate for Discharge 03/22/2021 0759 by Vivien Rota, RN Outcome: Progressing Goal: Diagnostic test results will improve 03/22/2021 1019 by Vivien Rota, RN Outcome: Adequate for Discharge 03/22/2021 0759 by Vivien Rota, RN Outcome: Progressing Goal: Respiratory complications will improve 03/22/2021 1019 by Vivien Rota, RN Outcome: Adequate for Discharge 03/22/2021 0759 by Vivien Rota, RN Outcome: Progressing Goal: Cardiovascular complication will be avoided 03/22/2021 1019 by Vivien Rota, RN Outcome: Adequate for Discharge 03/22/2021 0759 by Vivien Rota, RN Outcome: Progressing   Problem: Activity: Goal: Risk for activity intolerance will decrease 03/22/2021 1019 by Vivien Rota, RN Outcome: Adequate for Discharge 03/22/2021 0759 by Vivien Rota, RN Outcome: Progressing   Problem: Nutrition: Goal: Adequate nutrition will be maintained 03/22/2021 1019 by Vivien Rota, RN Outcome:  Adequate for Discharge 03/22/2021 0759 by Vivien Rota, RN Outcome: Progressing   Problem: Coping: Goal: Level of anxiety will decrease 03/22/2021 1019 by Vivien Rota, RN Outcome: Adequate for Discharge 03/22/2021 0759 by Vivien Rota, RN Outcome: Progressing   Problem: Elimination: Goal: Will not experience complications related to bowel motility 03/22/2021 1019 by Vivien Rota, RN Outcome: Adequate for Discharge 03/22/2021 0759 by Vivien Rota, RN Outcome: Progressing Goal: Will not experience complications related to urinary retention 03/22/2021 1019 by Vivien Rota, RN Outcome: Adequate for Discharge 03/22/2021 0759 by Vivien Rota, RN Outcome: Progressing   Problem: Pain Managment: Goal: General experience of comfort will improve 03/22/2021 1019 by Vivien Rota, RN Outcome: Adequate for Discharge 03/22/2021 0759 by Vivien Rota, RN Outcome: Progressing   Problem: Safety: Goal: Ability to remain free from injury will improve 03/22/2021 1019 by Vivien Rota, RN Outcome: Adequate for Discharge 03/22/2021 0759 by Vivien Rota, RN Outcome: Progressing   Problem: Skin Integrity: Goal: Risk for impaired skin integrity will decrease 03/22/2021 1019 by Vivien Rota, RN Outcome: Adequate for Discharge 03/22/2021 0759 by Vivien Rota, RN Outcome: Progressing

## 2021-03-22 NOTE — Telephone Encounter (Signed)
Hospital called to schedule a patient. Patient needs to be scheduled for New Patient/Hosp F/U appt. Also needs to be given her biopsy results during the visit. The doctors first opening is in June. Can you help to schedule her sooner? Told nurse that I would consult with the provider and nurse, then call to schedule

## 2021-03-22 NOTE — Progress Notes (Signed)
IV removed and site benign.  Reviewed discharge instructions and verbalized understanding

## 2021-03-22 NOTE — Plan of Care (Signed)

## 2021-03-25 LAB — SURGICAL PATHOLOGY

## 2021-03-25 NOTE — Telephone Encounter (Signed)
When I called the patient she chose 04/17/21 @1 :15. When I attempted to place her in the spot it said emergency only, again.

## 2021-03-25 NOTE — Telephone Encounter (Signed)
Dr. Marius Ditch is 100% booked on both days. I can't over book or override the schedule.

## 2021-03-28 DIAGNOSIS — K50912 Crohn's disease, unspecified, with intestinal obstruction: Secondary | ICD-10-CM | POA: Diagnosis not present

## 2021-03-29 DIAGNOSIS — K5000 Crohn's disease of small intestine without complications: Secondary | ICD-10-CM | POA: Diagnosis not present

## 2021-03-29 DIAGNOSIS — R7401 Elevation of levels of liver transaminase levels: Secondary | ICD-10-CM | POA: Diagnosis not present

## 2021-04-01 ENCOUNTER — Telehealth: Payer: Self-pay

## 2021-04-01 NOTE — Telephone Encounter (Signed)
Patient called to cancel her appt on May, 25th 2022. Patient doesn't have any appts or upcoming procedures with [place].

## 2021-04-09 DIAGNOSIS — K5000 Crohn's disease of small intestine without complications: Secondary | ICD-10-CM | POA: Diagnosis not present

## 2021-04-23 ENCOUNTER — Other Ambulatory Visit: Payer: Self-pay | Admitting: Internal Medicine

## 2021-04-23 DIAGNOSIS — Z1231 Encounter for screening mammogram for malignant neoplasm of breast: Secondary | ICD-10-CM

## 2021-04-26 DIAGNOSIS — K5000 Crohn's disease of small intestine without complications: Secondary | ICD-10-CM | POA: Diagnosis not present

## 2021-04-29 ENCOUNTER — Other Ambulatory Visit: Payer: Self-pay

## 2021-04-29 ENCOUNTER — Ambulatory Visit
Admission: RE | Admit: 2021-04-29 | Discharge: 2021-04-29 | Disposition: A | Discharge status: Home / Self Care | Payer: PPO | Source: Ambulatory Visit | Attending: Internal Medicine | Admitting: Internal Medicine

## 2021-04-29 DIAGNOSIS — Z1231 Encounter for screening mammogram for malignant neoplasm of breast: Secondary | ICD-10-CM | POA: Insufficient documentation

## 2021-05-24 DIAGNOSIS — K5000 Crohn's disease of small intestine without complications: Secondary | ICD-10-CM | POA: Diagnosis not present

## 2021-05-28 DIAGNOSIS — I10 Essential (primary) hypertension: Secondary | ICD-10-CM | POA: Diagnosis not present

## 2021-05-28 DIAGNOSIS — N1831 Chronic kidney disease, stage 3a: Secondary | ICD-10-CM | POA: Diagnosis not present

## 2021-05-28 DIAGNOSIS — J449 Chronic obstructive pulmonary disease, unspecified: Secondary | ICD-10-CM | POA: Diagnosis not present

## 2021-06-25 ENCOUNTER — Other Ambulatory Visit: Payer: Self-pay

## 2021-06-25 ENCOUNTER — Emergency Department
Admission: EM | Admit: 2021-06-25 | Discharge: 2021-06-25 | Disposition: A | Discharge status: Home / Self Care | Payer: PPO | Attending: Emergency Medicine | Admitting: Emergency Medicine

## 2021-06-25 ENCOUNTER — Emergency Department: Payer: PPO

## 2021-06-25 DIAGNOSIS — N281 Cyst of kidney, acquired: Secondary | ICD-10-CM | POA: Diagnosis not present

## 2021-06-25 DIAGNOSIS — J449 Chronic obstructive pulmonary disease, unspecified: Secondary | ICD-10-CM | POA: Insufficient documentation

## 2021-06-25 DIAGNOSIS — R109 Unspecified abdominal pain: Secondary | ICD-10-CM | POA: Diagnosis present

## 2021-06-25 DIAGNOSIS — K5090 Crohn's disease, unspecified, without complications: Secondary | ICD-10-CM | POA: Diagnosis not present

## 2021-06-25 DIAGNOSIS — N200 Calculus of kidney: Secondary | ICD-10-CM | POA: Diagnosis not present

## 2021-06-25 DIAGNOSIS — K3189 Other diseases of stomach and duodenum: Secondary | ICD-10-CM | POA: Diagnosis not present

## 2021-06-25 DIAGNOSIS — N189 Chronic kidney disease, unspecified: Secondary | ICD-10-CM | POA: Insufficient documentation

## 2021-06-25 DIAGNOSIS — F17200 Nicotine dependence, unspecified, uncomplicated: Secondary | ICD-10-CM | POA: Insufficient documentation

## 2021-06-25 DIAGNOSIS — I129 Hypertensive chronic kidney disease with stage 1 through stage 4 chronic kidney disease, or unspecified chronic kidney disease: Secondary | ICD-10-CM | POA: Diagnosis not present

## 2021-06-25 DIAGNOSIS — K6389 Other specified diseases of intestine: Secondary | ICD-10-CM | POA: Diagnosis not present

## 2021-06-25 DIAGNOSIS — K5010 Crohn's disease of large intestine without complications: Secondary | ICD-10-CM

## 2021-06-25 DIAGNOSIS — R1084 Generalized abdominal pain: Secondary | ICD-10-CM | POA: Diagnosis not present

## 2021-06-25 LAB — URINALYSIS, COMPLETE (UACMP) WITH MICROSCOPIC
Bilirubin Urine: NEGATIVE
Glucose, UA: NEGATIVE mg/dL
Hgb urine dipstick: NEGATIVE
Ketones, ur: 5 mg/dL — AB
Nitrite: NEGATIVE
Protein, ur: 100 mg/dL — AB
Specific Gravity, Urine: 1.029 (ref 1.005–1.030)
pH: 5 (ref 5.0–8.0)

## 2021-06-25 LAB — CBC
HCT: 41.3 % (ref 36.0–46.0)
Hemoglobin: 13.7 g/dL (ref 12.0–15.0)
MCH: 32.2 pg (ref 26.0–34.0)
MCHC: 33.2 g/dL (ref 30.0–36.0)
MCV: 97.2 fL (ref 80.0–100.0)
Platelets: 218 10*3/uL (ref 150–400)
RBC: 4.25 MIL/uL (ref 3.87–5.11)
RDW: 13.2 % (ref 11.5–15.5)
WBC: 8.1 10*3/uL (ref 4.0–10.5)
nRBC: 0 % (ref 0.0–0.2)

## 2021-06-25 LAB — COMPREHENSIVE METABOLIC PANEL
ALT: 14 U/L (ref 0–44)
AST: 21 U/L (ref 15–41)
Albumin: 3.7 g/dL (ref 3.5–5.0)
Alkaline Phosphatase: 88 U/L (ref 38–126)
Anion gap: 9 (ref 5–15)
BUN: 20 mg/dL (ref 8–23)
CO2: 20 mmol/L — ABNORMAL LOW (ref 22–32)
Calcium: 9.5 mg/dL (ref 8.9–10.3)
Chloride: 109 mmol/L (ref 98–111)
Creatinine, Ser: 2.05 mg/dL — ABNORMAL HIGH (ref 0.44–1.00)
GFR, Estimated: 25 mL/min — ABNORMAL LOW (ref >60)
Glucose, Bld: 116 mg/dL — ABNORMAL HIGH (ref 70–99)
Potassium: 4.1 mmol/L (ref 3.5–5.1)
Sodium: 138 mmol/L (ref 135–145)
Total Bilirubin: 0.9 mg/dL (ref 0.3–1.2)
Total Protein: 7.2 g/dL (ref 6.5–8.1)

## 2021-06-25 LAB — LIPASE, BLOOD: Lipase: 29 U/L (ref 11–51)

## 2021-06-25 MED ORDER — METOCLOPRAMIDE HCL 5 MG/ML IJ SOLN
10.0000 mg | Freq: Once | INTRAMUSCULAR | Status: AC
  Administered 2021-06-25: 10 mg via INTRAVENOUS
  Filled 2021-06-25: qty 2

## 2021-06-25 MED ORDER — OXYCODONE-ACETAMINOPHEN 5-325 MG PO TABS: 1.0000 | ORAL_TABLET | ORAL | 0 refills | Status: DC | PRN

## 2021-06-25 MED ORDER — PREDNISONE 10 MG PO TABS
10.0000 mg | ORAL_TABLET | Freq: Every day | ORAL | 0 refills | Status: DC
Start: 2021-06-25 — End: 2021-07-29

## 2021-06-25 MED ORDER — FENTANYL CITRATE (PF) 100 MCG/2ML IJ SOLN
50.0000 ug | Freq: Once | INTRAMUSCULAR | Status: AC
  Administered 2021-06-25: 50 ug via INTRAVENOUS
  Filled 2021-06-25: qty 2

## 2021-06-25 MED ORDER — OXYCODONE-ACETAMINOPHEN 5-325 MG PO TABS
1.0000 | ORAL_TABLET | Freq: Once | ORAL | Status: AC
  Administered 2021-06-25: 1 via ORAL
  Filled 2021-06-25: qty 1

## 2021-06-25 MED ORDER — SODIUM CHLORIDE 0.9 % IV BOLUS
1000.0000 mL | Freq: Once | INTRAVENOUS | Status: AC
  Administered 2021-06-25: 1000 mL via INTRAVENOUS

## 2021-06-25 MED ORDER — MORPHINE SULFATE (PF) 4 MG/ML IV SOLN
4.0000 mg | Freq: Once | INTRAVENOUS | Status: AC
  Administered 2021-06-25: 4 mg via INTRAVENOUS
  Filled 2021-06-25: qty 1

## 2021-06-25 MED ORDER — ONDANSETRON HCL 4 MG/2ML IJ SOLN
4.0000 mg | Freq: Once | INTRAMUSCULAR | Status: AC
  Administered 2021-06-25: 4 mg via INTRAVENOUS
  Filled 2021-06-25: qty 2

## 2021-06-25 NOTE — ED Notes (Signed)
Patient reported continued nausea "in the back of her throat" despite the Zofran. No vomiting reported. Notified MD who added Reglan.

## 2021-06-25 NOTE — ED Triage Notes (Signed)
Pt to ED for crohn's flare up since Saturday. Reports diarrhea, dehydration. NAD noted

## 2021-06-25 NOTE — ED Triage Notes (Signed)
Presents with abd pain  thinks she is having a "crohn's flare"

## 2021-06-25 NOTE — ED Provider Notes (Signed)
[facility] Emergency Department Provider Note  Time seen: 4:03 PM  I have reviewed the triage vital signs and the nursing notes.   HISTORY  Chief Complaint Abdominal Pain   HPI Bridget Huber is a 70 y.o. female with a past medical history of anemia, CKD, COPD, Crohn's, hypertension, presents emergency department for 1 week of nausea, vomiting, diarrhea and abdominal pain.  Patient states this is typical of her Crohn's flares.  States last flare was approximately 4 to 5 months ago and resolved after use of prednisone.  Patient states she follows up with Dr. Jacqulyn Liner but has not been able to get in with her.  Patient describes abdominal pain as moderate and diffuse more so on the right side.  Denies any black or bloody stool.  No known fevers.   Past Medical History:  Diagnosis Date   Anemia    Arthritis    Chronic kidney disease    kidney stones   COPD (chronic obstructive pulmonary disease) (HCC)    Crohn's disease ([place])    DVT (deep venous thrombosis) ([place])    Hypertension     Patient Active Problem List   Diagnosis Date Noted   Abdominal pain 03/19/2021   Acute Crohn's disease with intestinal obstruction ([place]) 03/19/2021   Tobacco abuse 03/19/2021   Iron deficiency anemia 03/19/2021   COPD (chronic obstructive pulmonary disease) (HCC)    Hypertension    Acute renal failure superimposed on stage 3a chronic kidney disease ([place])    Hypokalemia     Past Surgical History:  Procedure Laterality Date   COLONOSCOPY N/A 05/31/2015   Procedure: COLONOSCOPY;  Surgeon: Manya Silvas, MD;  Location: [facility];  Service: Endoscopy;  Laterality: N/A;   COLONOSCOPY WITH PROPOFOL N/A 03/21/2021   Procedure: COLONOSCOPY WITH PROPOFOL;  Surgeon: Lin Landsman, MD;  Location: [facility] ENDOSCOPY;  Service: Gastroenterology;  Laterality: N/A;    Prior to Admission medications   Medication Sig Start Date End Date Taking? Authorizing Provider  acetaminophen  (TYLENOL) 650 MG CR tablet Take 1,300 mg by mouth every 8 (eight) hours as needed for pain.    [provider]  azaTHIOprine (IMURAN) 50 MG tablet Take 100 mg by mouth daily.    [provider]  Calcium Carbonate-Vitamin D 600-400 MG-UNIT tablet Take 1 tablet by mouth 2 (two) times daily.    [provider]  Cholecalciferol 50 MCG (2000 UT) CAPS Take 2,000 Units by mouth daily.    [provider]  cyanocobalamin 1000 MCG tablet Take 1,000 mcg by mouth daily.    [provider]  ferrous sulfate 325 (65 FE) MG tablet Take 325 mg by mouth daily.    [provider]  Multiple Vitamin (MULTI-VITAMIN) tablet Take 1 tablet by mouth daily.    [provider]  omeprazole (PRILOSEC) 20 MG capsule Take 20 mg by mouth daily.    [provider]  predniSONE (DELTASONE) 10 MG tablet Take '40mg'$  once daily with breakfast for two weeks, then 30 mg daily with breakfast  for 1 week, then 20 mg daily with breakfast for 1 week ,then 10 mg daily with breakfast for 1 week, then 5 mg daily with breakfast for 1 week ,then stop 03/21/21   Florencia Reasons, MD    Allergies  Allergen Reactions   Aspirin     Family History  Problem Relation Age of Onset   Hypertension Brother    Diabetes Mellitus II Brother     Social  History Social History   Tobacco Use   Smoking status: Every Day   Smokeless tobacco: Never  Substance Use Topics   Alcohol use: Not Currently   Drug use: Never    Review of Systems Constitutional: Negative for fever. Cardiovascular: Negative for chest pain. Respiratory: Negative for shortness of breath. Gastrointestinal: Diffuse abdominal pain.  Positive for nausea vomiting or diarrhea. Musculoskeletal: Negative for musculoskeletal complaints Skin: Negative for skin complaints  Neurological: Negative for headache All other ROS negative  ____________________________________________   PHYSICAL EXAM:  VITAL SIGNS: ED Triage  Vitals  Enc Vitals Group     BP 06/25/21 1420 (!) 150/93     Pulse Rate 06/25/21 1420 (!) 117     Resp 06/25/21 1420 16     Temp 06/25/21 1420 98.8 F (37.1 C)     Temp Source 06/25/21 1420 Oral     SpO2 06/25/21 1420 97 %     Weight 06/25/21 1425 165 lb (74.8 kg)     Height 06/25/21 1425 '5\' 2"'$  (1.575 m)     Head Circumference --      Peak Flow --      Pain Score 06/25/21 1425 9     Pain Loc --      Pain Edu? --      Excl. in [place]? --    Constitutional: Alert and oriented. Well appearing and in no distress. Eyes: Normal exam ENT      Head: Normocephalic and atraumatic.      Mouth/Throat: Mucous membranes are moist. Cardiovascular: Normal rate, regular rhythm.  Respiratory: Normal respiratory effort without tachypnea nor retractions. Breath sounds are clear  Gastrointestinal: Soft and nontender. No distention.   Musculoskeletal: Nontender with normal range of motion in all extremities. Neurologic:  Normal speech and language. No gross focal neurologic deficits  Skin:  Skin is warm, dry and intact.  Psychiatric: Mood and affect are normal.  ____________________________________________    RADIOLOGY  CT shows mild wall thickening of the distal ileum.  Chronic versus acute changes.  No other acute findings.  ____________________________________________   INITIAL IMPRESSION / ASSESSMENT AND PLAN / ED COURSE  Pertinent labs & imaging results that were available during my care of the patient were reviewed by me and considered in my medical decision making (see chart for details).   Patient presents emergency department for abdominal pain diarrhea and nausea.  Patient has a history of Crohn's disease but states this is typical of her Crohn's flare.  We will check labs, obtain a CT scan and continue to closely monitor.  We will treat pain nausea and IV hydrate while awaiting results.  Labs are largely within normal limits/baseline to the patient.  CT pending.  CT shows findings  of possible acute versus chronic inflammatory bowel disease.  Patient still receiving IV fluids, feeling better after pain medication but continues to have some pain.  We will continue to monitor in the emergency department plan to discharge with pain medication and prednisone for further management.  Patient agreeable to plan.  Bridget Huber was evaluated in Emergency Department on 06/25/2021 for the symptoms described in the history of present illness. She was evaluated in the context of the global COVID-19 pandemic, which necessitated consideration that the patient might be at risk for infection with the SARS-CoV-2 virus that causes COVID-19. Institutional protocols and algorithms that pertain to the evaluation of patients at risk for COVID-19 are in a state of rapid change based on information released by regulatory  bodies including the CDC and federal and state organizations. These policies and algorithms were followed during the patient's care in the ED.   ____________________________________________   FINAL CLINICAL IMPRESSION(S) / ED DIAGNOSES  Abdominal pain Crohn's flare   Harvest Dark, MD 06/25/21 1757

## 2021-07-03 DIAGNOSIS — K50819 Crohn's disease of both small and large intestine with unspecified complications: Secondary | ICD-10-CM | POA: Diagnosis not present

## 2021-07-04 DIAGNOSIS — K5080 Crohn's disease of both small and large intestine without complications: Secondary | ICD-10-CM | POA: Diagnosis not present

## 2021-07-09 DIAGNOSIS — K5080 Crohn's disease of both small and large intestine without complications: Secondary | ICD-10-CM | POA: Diagnosis not present

## 2021-07-11 ENCOUNTER — Ambulatory Visit: Payer: PPO | Admitting: Dermatology

## 2021-07-11 ENCOUNTER — Other Ambulatory Visit: Payer: Self-pay

## 2021-07-11 DIAGNOSIS — D1800 Hemangioma unspecified site: Secondary | ICD-10-CM

## 2021-07-11 DIAGNOSIS — D229 Melanocytic nevi, unspecified: Secondary | ICD-10-CM

## 2021-07-11 DIAGNOSIS — L304 Erythema intertrigo: Secondary | ICD-10-CM | POA: Diagnosis not present

## 2021-07-11 DIAGNOSIS — L814 Other melanin hyperpigmentation: Secondary | ICD-10-CM | POA: Diagnosis not present

## 2021-07-11 DIAGNOSIS — Z1283 Encounter for screening for malignant neoplasm of skin: Secondary | ICD-10-CM

## 2021-07-11 DIAGNOSIS — K50919 Crohn's disease, unspecified, with unspecified complications: Secondary | ICD-10-CM | POA: Diagnosis not present

## 2021-07-11 DIAGNOSIS — L578 Other skin changes due to chronic exposure to nonionizing radiation: Secondary | ICD-10-CM | POA: Diagnosis not present

## 2021-07-11 NOTE — Patient Instructions (Addendum)
If you have any questions or concerns for your doctor, please call our main line at [phone] and press option 4 to reach your doctor's medical assistant. If no one answers, please leave a voicemail as directed and we will return your call as soon as possible. Messages left after 4 pm will be answered the following business day.   You may also send us a message via MyChart. We typically respond to MyChart messages within 1-2 business days.  For prescription refills, please ask your pharmacy to contact our office. Our fax number is [phone].  If you have an urgent issue when the clinic is closed that cannot wait until the next business day, you can page your doctor at the number below.    Please note that while we do our best to be available for urgent issues outside of office hours, we are not available 24/7.   If you have an urgent issue and are unable to reach us, you may choose to seek medical care at your doctor's office, retail clinic, urgent care center, or emergency room.  If you have a medical emergency, please immediately call 911 or go to the emergency department.  Pager Numbers  - Dr. Kowalski: [phone]  - Dr. Moye: [phone]  - Dr. Stewart: [phone]  In the event of inclement weather, please call our main line at [phone] for an update on the status of any delays or closures.  Dermatology Medication Tips: Please keep the boxes that topical medications come in in order to help keep track of the instructions about where and how to use these. Pharmacies typically print the medication instructions only on the boxes and not directly on the medication tubes.   If your medication is too expensive, please contact our office at [phone] option 4 or send us a message through MyChart.   We are unable to tell what your co-pay for medications will be in advance as this is different depending on your insurance coverage. However, we may be able to find a substitute  medication at lower cost or fill out paperwork to get insurance to cover a needed medication.   If a prior authorization is required to get your medication covered by your insurance company, please allow us 1-2 business days to complete this process.  Drug prices often vary depending on where the prescription is filled and some pharmacies may offer cheaper prices.  The website www.goodrx.com contains coupons for medications through different pharmacies. The prices here do not account for what the cost may be with help from insurance (it may be cheaper with your insurance), but the website can give you the price if you did not use any insurance.  - You can print the associated coupon and take it with your prescription to the pharmacy.  - You may also stop by our office during regular business hours and pick up a GoodRx coupon card.  - If you need your prescription sent electronically to a different pharmacy, notify our office through [facility] MyChart or by phone at [phone] option 4.  Recommend taking Heliocare sun protection supplement daily in sunny weather for additional sun protection. For maximum protection on the sunniest days, you can take up to 2 capsules of regular Heliocare OR take 1 capsule of Heliocare Ultra. For prolonged exposure (such as a full day in the sun), you can repeat your dose of the supplement 4 hours after your first dose. Heliocare can be purchased at [facility] or at www.heliocare.com.    

## 2021-07-11 NOTE — Progress Notes (Signed)
   New Patient Visit  Subjective  Bridget Huber is a 70 y.o. female who presents for the following: Annual Exam (Patient has Crohn's disease and started Remicade in May. Her doctor recommended that she have an all over skin check, but she hasn't noticed anything new or changing on her skin that she is concerned about.). The patient presents for Total-Body Skin Exam (TBSE) for skin cancer screening and mole check.  The following portions of the chart were reviewed this encounter and updated as appropriate:   Tobacco  Allergies  Meds  Problems  Med Hx  Surg Hx  Fam Hx     Review of Systems:  No other skin or systemic complaints except as noted in HPI or Assessment and Plan.  Objective  Well appearing patient in no apparent distress; mood and affect are within normal limits.  A full examination was performed including scalp, head, eyes, ears, nose, lips, neck, chest, axillae, abdomen, back, buttocks, bilateral upper extremities, bilateral lower extremities, hands, feet, fingers, toes, fingernails, and toenails. All findings within normal limits unless otherwise noted below.  Inframammary Clear.  Assessment & Plan  Crohn's disease with complication, unspecified gastrointestinal tract location [facility]) GI tract  Continue care with GI physician - discussed possible increased risk of skin cancer while on Remicade.   Erythema intertrigo Inframammary  Continue OTC powder to prevent moisture from forming.   Lentigines - Scattered tan macules - Due to sun exposure - Benign-appering, observe - Recommend daily broad spectrum sunscreen SPF 30+ to sun-exposed areas, reapply every 2 hours as needed. - Call for any changes  Seborrheic Keratoses - Stuck-on, waxy, tan-brown papules and/or plaques  - Benign-appearing - Discussed benign etiology and prognosis. - Observe - Call for any changes  Melanocytic Nevi - Tan-brown and/or pink-flesh-colored symmetric macules and papules - Benign  appearing on exam today - Observation - Call clinic for new or changing moles - Recommend daily use of broad spectrum spf 30+ sunscreen to sun-exposed areas.   Hemangiomas - Red papules - Discussed benign nature - Observe - Call for any changes  Actinic Damage - Chronic condition, secondary to cumulative UV/sun exposure - diffuse scaly erythematous macules with underlying dyspigmentation - Recommend daily broad spectrum sunscreen SPF 30+ to sun-exposed areas, reapply every 2 hours as needed.  - Staying in the shade or wearing long sleeves, sun glasses (UVA+UVB protection) and wide brim hats (4-inch brim around the entire circumference of the hat) are also recommended for sun protection.  - Call for new or changing lesions.  Skin cancer screening performed today.  Return in about 1 year (around 07/11/2022) for TBSE.  Luther Redo, CMA, am acting as scribe for Forest Gleason, MD .  Documentation: I have reviewed the above documentation for accuracy and completeness, and I agree with the above.  Forest Gleason, MD

## 2021-07-15 ENCOUNTER — Encounter: Payer: Self-pay | Admitting: Dermatology

## 2021-07-19 DIAGNOSIS — K5000 Crohn's disease of small intestine without complications: Secondary | ICD-10-CM | POA: Diagnosis not present

## 2021-07-26 ENCOUNTER — Emergency Department: Payer: PPO

## 2021-07-26 ENCOUNTER — Other Ambulatory Visit: Payer: Self-pay

## 2021-07-26 ENCOUNTER — Inpatient Hospital Stay
Admission: EM | Admit: 2021-07-26 | Discharge: 2021-07-29 | DRG: 386 | Disposition: A | Discharge status: Home / Self Care | Payer: PPO | Attending: Hospitalist | Admitting: Hospitalist

## 2021-07-26 DIAGNOSIS — Z20822 Contact with and (suspected) exposure to covid-19: Secondary | ICD-10-CM | POA: Diagnosis present

## 2021-07-26 DIAGNOSIS — Z886 Allergy status to analgesic agent status: Secondary | ICD-10-CM

## 2021-07-26 DIAGNOSIS — Z66 Do not resuscitate: Secondary | ICD-10-CM | POA: Diagnosis present

## 2021-07-26 DIAGNOSIS — F17200 Nicotine dependence, unspecified, uncomplicated: Secondary | ICD-10-CM | POA: Diagnosis not present

## 2021-07-26 DIAGNOSIS — N1831 Chronic kidney disease, stage 3a: Secondary | ICD-10-CM | POA: Diagnosis present

## 2021-07-26 DIAGNOSIS — E876 Hypokalemia: Secondary | ICD-10-CM | POA: Diagnosis present

## 2021-07-26 DIAGNOSIS — N179 Acute kidney failure, unspecified: Secondary | ICD-10-CM | POA: Diagnosis not present

## 2021-07-26 DIAGNOSIS — E872 Acidosis: Secondary | ICD-10-CM | POA: Diagnosis present

## 2021-07-26 DIAGNOSIS — K219 Gastro-esophageal reflux disease without esophagitis: Secondary | ICD-10-CM | POA: Diagnosis present

## 2021-07-26 DIAGNOSIS — K5010 Crohn's disease of large intestine without complications: Secondary | ICD-10-CM | POA: Diagnosis present

## 2021-07-26 DIAGNOSIS — D696 Thrombocytopenia, unspecified: Secondary | ICD-10-CM | POA: Diagnosis present

## 2021-07-26 DIAGNOSIS — Z79899 Other long term (current) drug therapy: Secondary | ICD-10-CM

## 2021-07-26 DIAGNOSIS — Z86718 Personal history of other venous thrombosis and embolism: Secondary | ICD-10-CM | POA: Diagnosis not present

## 2021-07-26 DIAGNOSIS — Z8249 Family history of ischemic heart disease and other diseases of the circulatory system: Secondary | ICD-10-CM

## 2021-07-26 DIAGNOSIS — I129 Hypertensive chronic kidney disease with stage 1 through stage 4 chronic kidney disease, or unspecified chronic kidney disease: Secondary | ICD-10-CM | POA: Diagnosis present

## 2021-07-26 DIAGNOSIS — K56699 Other intestinal obstruction unspecified as to partial versus complete obstruction: Secondary | ICD-10-CM | POA: Diagnosis present

## 2021-07-26 DIAGNOSIS — N3000 Acute cystitis without hematuria: Secondary | ICD-10-CM

## 2021-07-26 DIAGNOSIS — K3189 Other diseases of stomach and duodenum: Secondary | ICD-10-CM | POA: Diagnosis not present

## 2021-07-26 DIAGNOSIS — B9620 Unspecified Escherichia coli [E. coli] as the cause of diseases classified elsewhere: Secondary | ICD-10-CM | POA: Diagnosis not present

## 2021-07-26 DIAGNOSIS — K5080 Crohn's disease of both small and large intestine without complications: Secondary | ICD-10-CM | POA: Diagnosis not present

## 2021-07-26 DIAGNOSIS — N200 Calculus of kidney: Secondary | ICD-10-CM | POA: Diagnosis not present

## 2021-07-26 DIAGNOSIS — K6389 Other specified diseases of intestine: Secondary | ICD-10-CM | POA: Diagnosis not present

## 2021-07-26 DIAGNOSIS — N183 Chronic kidney disease, stage 3 unspecified: Secondary | ICD-10-CM

## 2021-07-26 DIAGNOSIS — J449 Chronic obstructive pulmonary disease, unspecified: Secondary | ICD-10-CM | POA: Diagnosis present

## 2021-07-26 DIAGNOSIS — I10 Essential (primary) hypertension: Secondary | ICD-10-CM | POA: Diagnosis present

## 2021-07-26 DIAGNOSIS — K5939 Other megacolon: Secondary | ICD-10-CM | POA: Diagnosis not present

## 2021-07-26 DIAGNOSIS — N390 Urinary tract infection, site not specified: Secondary | ICD-10-CM | POA: Diagnosis not present

## 2021-07-26 DIAGNOSIS — N3001 Acute cystitis with hematuria: Secondary | ICD-10-CM | POA: Diagnosis not present

## 2021-07-26 DIAGNOSIS — K50012 Crohn's disease of small intestine with intestinal obstruction: Secondary | ICD-10-CM | POA: Diagnosis not present

## 2021-07-26 DIAGNOSIS — R000 Tachycardia, unspecified: Secondary | ICD-10-CM | POA: Diagnosis not present

## 2021-07-26 LAB — COMPREHENSIVE METABOLIC PANEL
ALT: 14 U/L (ref 0–44)
AST: 16 U/L (ref 15–41)
Albumin: 3 g/dL — ABNORMAL LOW (ref 3.5–5.0)
Alkaline Phosphatase: 105 U/L (ref 38–126)
Anion gap: 9 (ref 5–15)
BUN: UNDETERMINED mg/dL (ref 8–23)
CO2: 17 mmol/L — ABNORMAL LOW (ref 22–32)
Calcium: 8.7 mg/dL — ABNORMAL LOW (ref 8.9–10.3)
Chloride: 113 mmol/L — ABNORMAL HIGH (ref 98–111)
Creatinine, Ser: 2.34 mg/dL — ABNORMAL HIGH (ref 0.44–1.00)
GFR, Estimated: 22 mL/min — ABNORMAL LOW (ref >60)
Glucose, Bld: 113 mg/dL — ABNORMAL HIGH (ref 70–99)
Potassium: 3.6 mmol/L (ref 3.5–5.1)
Sodium: 139 mmol/L (ref 135–145)
Total Bilirubin: 1.1 mg/dL (ref 0.3–1.2)
Total Protein: 6.3 g/dL — ABNORMAL LOW (ref 6.5–8.1)

## 2021-07-26 LAB — URINALYSIS, COMPLETE (UACMP) WITH MICROSCOPIC
Bilirubin Urine: NEGATIVE
Glucose, UA: NEGATIVE mg/dL
Nitrite: NEGATIVE
Protein, ur: 100 mg/dL — AB
Specific Gravity, Urine: 1.02 (ref 1.005–1.030)
WBC, UA: >50 WBC/hpf — ABNORMAL HIGH (ref 0–5)
pH: 5.5 (ref 5.0–8.0)

## 2021-07-26 LAB — LIPASE, BLOOD: Lipase: 24 U/L (ref 11–51)

## 2021-07-26 LAB — CBC
HCT: 38.7 % (ref 36.0–46.0)
Hemoglobin: 13.4 g/dL (ref 12.0–15.0)
MCH: 31.1 pg (ref 26.0–34.0)
MCHC: 34.6 g/dL (ref 30.0–36.0)
MCV: 89.8 fL (ref 80.0–100.0)
Platelets: 147 10*3/uL — ABNORMAL LOW (ref 150–400)
RBC: 4.31 MIL/uL (ref 3.87–5.11)
RDW: 15.5 % (ref 11.5–15.5)
WBC: 5.6 10*3/uL (ref 4.0–10.5)
nRBC: 0.4 % — ABNORMAL HIGH (ref 0.0–0.2)

## 2021-07-26 LAB — RESP PANEL BY RT-PCR (FLU A&B, COVID) ARPGX2
Influenza A by PCR: NEGATIVE
Influenza B by PCR: NEGATIVE
SARS Coronavirus 2 by RT PCR: NEGATIVE

## 2021-07-26 MED ORDER — PANTOPRAZOLE SODIUM 40 MG IV SOLR
40.0000 mg | INTRAVENOUS | Status: DC
  Administered 2021-07-27: 40 mg via INTRAVENOUS
  Filled 2021-07-26: qty 40

## 2021-07-26 MED ORDER — LACTATED RINGERS IV BOLUS
2000.0000 mL | Freq: Once | INTRAVENOUS | Status: AC
  Administered 2021-07-26: 2000 mL via INTRAVENOUS

## 2021-07-26 MED ORDER — SODIUM CHLORIDE 0.9 % IV SOLN
1.0000 g | INTRAVENOUS | Status: DC
  Administered 2021-07-27 – 2021-07-28 (×2): 1 g via INTRAVENOUS
  Filled 2021-07-26: qty 10
  Filled 2021-07-26: qty 1
  Filled 2021-07-26: qty 10

## 2021-07-26 MED ORDER — METRONIDAZOLE 500 MG/100ML IV SOLN
500.0000 mg | Freq: Once | INTRAVENOUS | Status: AC
  Administered 2021-07-26: 500 mg via INTRAVENOUS
  Filled 2021-07-26: qty 100

## 2021-07-26 MED ORDER — METHYLPREDNISOLONE SODIUM SUCC 125 MG IJ SOLR
125.0000 mg | Freq: Every day | INTRAMUSCULAR | Status: DC
  Administered 2021-07-27: 125 mg via INTRAVENOUS
  Filled 2021-07-26: qty 2

## 2021-07-26 MED ORDER — TRAZODONE HCL 50 MG PO TABS: 25.0000 mg | ORAL_TABLET | Freq: Every evening | ORAL | Status: DC | PRN

## 2021-07-26 MED ORDER — MORPHINE SULFATE (PF) 4 MG/ML IV SOLN
4.0000 mg | Freq: Once | INTRAVENOUS | Status: AC
  Administered 2021-07-26: 4 mg via INTRAVENOUS
  Filled 2021-07-26: qty 1

## 2021-07-26 MED ORDER — ENOXAPARIN SODIUM 30 MG/0.3ML IJ SOSY
30.0000 mg | PREFILLED_SYRINGE | INTRAMUSCULAR | Status: DC
  Administered 2021-07-26 – 2021-07-28 (×3): 30 mg via SUBCUTANEOUS
  Filled 2021-07-26 (×3): qty 0.3

## 2021-07-26 MED ORDER — ONDANSETRON 4 MG PO TBDP
4.0000 mg | ORAL_TABLET | Freq: Once | ORAL | Status: AC | PRN
  Administered 2021-07-26: 4 mg via ORAL
  Filled 2021-07-26: qty 1

## 2021-07-26 MED ORDER — ONDANSETRON HCL 4 MG/2ML IJ SOLN: 4.0000 mg | Freq: Four times a day (QID) | INTRAMUSCULAR | Status: DC | PRN

## 2021-07-26 MED ORDER — ACETAMINOPHEN 650 MG RE SUPP: 650.0000 mg | Freq: Four times a day (QID) | RECTAL | Status: DC | PRN

## 2021-07-26 MED ORDER — SODIUM CHLORIDE 0.9 % IV SOLN
1.0000 g | Freq: Once | INTRAVENOUS | Status: AC
  Administered 2021-07-26: 1 g via INTRAVENOUS
  Filled 2021-07-26: qty 10

## 2021-07-26 MED ORDER — OXYCODONE HCL 5 MG PO TABS: 5.0000 mg | ORAL_TABLET | ORAL | Status: DC | PRN

## 2021-07-26 MED ORDER — MORPHINE SULFATE (PF) 2 MG/ML IV SOLN: 2.0000 mg | INTRAVENOUS | Status: DC | PRN

## 2021-07-26 MED ORDER — METRONIDAZOLE 500 MG/100ML IV SOLN
500.0000 mg | Freq: Three times a day (TID) | INTRAVENOUS | Status: DC
  Administered 2021-07-27 – 2021-07-29 (×8): 500 mg via INTRAVENOUS
  Filled 2021-07-26 (×11): qty 100

## 2021-07-26 MED ORDER — SODIUM CHLORIDE 0.9% FLUSH
3.0000 mL | Freq: Two times a day (BID) | INTRAVENOUS | Status: DC
  Administered 2021-07-26 – 2021-07-29 (×6): 3 mL via INTRAVENOUS

## 2021-07-26 MED ORDER — ENOXAPARIN SODIUM 40 MG/0.4ML IJ SOSY: 40.0000 mg | PREFILLED_SYRINGE | INTRAMUSCULAR | Status: DC

## 2021-07-26 MED ORDER — LACTATED RINGERS IV SOLN: INTRAVENOUS | Status: DC

## 2021-07-26 MED ORDER — METHYLPREDNISOLONE SODIUM SUCC 40 MG IJ SOLR
40.0000 mg | Freq: Once | INTRAMUSCULAR | Status: AC
  Administered 2021-07-26: 40 mg via INTRAVENOUS
  Filled 2021-07-26: qty 1

## 2021-07-26 MED ORDER — ONDANSETRON HCL 4 MG PO TABS: 4.0000 mg | ORAL_TABLET | Freq: Four times a day (QID) | ORAL | Status: DC | PRN

## 2021-07-26 MED ORDER — ACETAMINOPHEN 325 MG PO TABS: 650.0000 mg | ORAL_TABLET | Freq: Four times a day (QID) | ORAL | Status: DC | PRN

## 2021-07-26 NOTE — Progress Notes (Signed)
PHARMACIST - PHYSICIAN COMMUNICATION  CONCERNING:  Enoxaparin (Lovenox) for DVT Prophylaxis    RECOMMENDATION: Patient was prescribed enoxaprin '40mg'$  q24 hours for VTE prophylaxis.   Filed Weights   07/26/21 1012 07/26/21 1017  Weight: 67.6 kg (149 lb) 67.6 kg (149 lb)    Body mass index is 27.25 kg/m.  Estimated Creatinine Clearance: 19.6 mL/min (A) (by C-G formula based on SCr of 2.34 mg/dL (H)).  Patient is candidate for enoxaparin '30mg'$  every 24 hours based on CrCl <91m/min or Weight <45kg  DESCRIPTION: [facility]  Patient is now receiving enoxaparin 30 mg every 24 hours    LVira Blanco PharmD Clinical Pharmacist  07/26/2021 9:14 PM

## 2021-07-26 NOTE — ED Notes (Signed)
Informed RN bed assigned 

## 2021-07-26 NOTE — ED Notes (Signed)
Pt lying in bed with husband at bedside. Pt repositioned in bed with head of bed elevated, and pt given drink. Pt not complaining of any severe pain at this time. Pt vitals stable.

## 2021-07-26 NOTE — ED Triage Notes (Signed)
Pt arrived via pov from home. Pt reports abd pain and n/v x 2 week. Unable to keep anything down. Pt reports having chron's disease. NAD noted in triage.

## 2021-07-26 NOTE — H&P (Signed)
[facility] History and Physical  Bridget Huber [id number] DOB: 1951-02-07 DOA: 07/26/2021  Referring physician: Dr. Ellender Hose PCP: Kirk Ruths, MD   Chief Complaint: abdominal pain  HPI: Bridget Huber is a 70 y.o. female with hx of Crohn's disease, COPD, CKD, hypertension, who presents with abdominal pain.  Patient was seen in the ED last month, she was treated with IV fluids, pain medicines, and discharged with p.o. prednisone.  Today reports that she received some relief from the prednisone she was discharged on but that it did not last.  Over the past days to weeks she has had increasingly worse abdominal pain located primarily in her right lower quadrant.  In addition she has had nausea, vomiting, diarrhea.  She endorses some bilious emesis but no blood.  She denies any bright red blood per rectum, though does note that her stool has looked like coffee grounds.  She endorses some urinary frequency but no other urinary symptoms.  She reports she has been on azathioprine for many years without any flareups, her last 1 was in the early 2000's when she had a bowel resection.  In the ED initial vital signs notable only for intermittent mild tachycardia.  Lab work-up showed CBC with borderline thrombocytopenia, CMP with creatinine of 2.3 above her baseline of 1.3, otherwise unremarkable.  Lipase was normal.  CT abdomen pelvis was obtained which showed findings consistent with acute on chronic Crohn's disease.  She was given IV methylprednisolone, 2 L LR bolus, ceftriaxone and Flagyl, IV pain medicine, and admitted for further management.  Review of Systems:  Pertinent positives and negative per HPI, all others reviewed and negative  Past Medical History:  Diagnosis Date   Anemia    Arthritis    Chronic kidney disease    kidney stones   COPD (chronic obstructive pulmonary disease) ([place])    Crohn's disease ([place])    DVT (deep venous thrombosis) ([place])    Hypertension     Past Surgical History:  Procedure Laterality Date   COLONOSCOPY N/A 05/31/2015   Procedure: COLONOSCOPY;  Surgeon: Manya Silvas, MD;  Location: [place];  Service: Endoscopy;  Laterality: N/A;   COLONOSCOPY WITH PROPOFOL N/A 03/21/2021   Procedure: COLONOSCOPY WITH PROPOFOL;  Surgeon: Lin Landsman, MD;  Location: [facility] ENDOSCOPY;  Service: Gastroenterology;  Laterality: N/A;   Social History:  reports that she has been smoking. She has never used smokeless tobacco. She reports that she does not currently use alcohol. She reports that she does not use drugs.  Allergies  Allergen Reactions   Aspirin     Family History  Problem Relation Age of Onset   Hypertension Brother    Diabetes Mellitus II Brother      Prior to Admission medications   Medication Sig Start Date End Date Taking? Authorizing Provider  acetaminophen (TYLENOL) 650 MG CR tablet Take 1,300 mg by mouth every 8 (eight) hours as needed for pain.    [provider]  azaTHIOprine (IMURAN) 50 MG tablet Take 100 mg by mouth daily.    [provider]  Calcium Carbonate-Vitamin D 600-400 MG-UNIT tablet Take 1 tablet by mouth 2 (two) times daily.    [provider]  Cholecalciferol 50 MCG (2000 UT) CAPS Take 2,000 Units by mouth daily.    [provider]  cyanocobalamin 1000 MCG tablet Take 1,000 mcg by mouth daily.    [provider]  ferrous sulfate 325 (65 FE) MG tablet Take 325 mg by mouth  daily.    [provider]  inFLIXimab (REMICADE) 100 MG injection Inject into the vein. 07/17/21   [provider]  methylPREDNISolone sodium succinate (SOLU-MEDROL) 40 mg/mL injection Inject into the vein. 04/09/21   [provider]  Multiple Vitamin (MULTI-VITAMIN) tablet Take 1 tablet by mouth daily.    [provider]  omeprazole (PRILOSEC) 20 MG capsule Take 20 mg by mouth daily.    [provider]  oxyCODONE-acetaminophen (PERCOCET)  5-325 MG tablet Take 1 tablet by mouth every 4 (four) hours as needed for severe pain. 06/25/21   Harvest Dark, MD  potassium chloride (KLOR-CON) 10 MEQ tablet Take by mouth. 03/28/21 03/28/22  [provider]  predniSONE (DELTASONE) 10 MG tablet Take 1 tablet (10 mg total) by mouth daily. Day 1-3: take 4 tablets PO daily Day 4-6: take 3 tablets PO daily Day 7-9: take 2 tablets PO daily Day 10-12: take 1 tablet PO daily Patient not taking: Reported on 07/11/2021 06/25/21   Harvest Dark, MD   Physical Exam: Vitals:   07/26/21 1800 07/26/21 1900 07/26/21 1930 07/26/21 2000  BP: 118/68 108/81 114/70 118/62  Pulse: (!) 104 93 94 94  Resp:   18   Temp:    98.1 F (36.7 C)  TempSrc:    Oral  SpO2: 96% 96% 95% 95%  Weight:      Height:        Wt Readings from Last 3 Encounters:  07/26/21 67.6 kg  06/25/21 74.8 kg  03/19/21 63.8 kg     General:  Appears calm and comfortable Eyes: PERRL, normal lids, irises & conjunctiva ENT: grossly normal hearing, lips Neck: no masses Cardiovascular: RRR, no m/r/g. No LE edema. Respiratory: CTA bilaterally, no w/r/r. Normal respiratory effort. Abdomen: soft, +bowel sounds throughout, significant tenderness to palpation in RLQ Skin: no rash or induration seen on limited exam Musculoskeletal: grossly normal tone BUE/BLE Psychiatric: grossly normal mood and affect, speech fluent and appropriate Neurologic: grossly non-focal.          Labs on Admission:  Basic Metabolic Panel: Recent Labs  Lab 07/26/21 1106  NA 139  K 3.6  CL 113*  CO2 17*  GLUCOSE 113*  BUN QUANTITY NOT SUFFICIENT, UNABLE TO PERFORM TEST  CREATININE 2.34*  CALCIUM 8.7*   Liver Function Tests: Recent Labs  Lab 07/26/21 1106  AST 16  ALT 14  ALKPHOS 105  BILITOT 1.1  PROT 6.3*  ALBUMIN 3.0*   Recent Labs  Lab 07/26/21 1106  LIPASE 24   No results for input(s): AMMONIA in the last 168 hours. CBC: Recent Labs  Lab 07/26/21 1106  WBC 5.6  HGB  13.4  HCT 38.7  MCV 89.8  PLT 147*   Cardiac Enzymes: No results for input(s): CKTOTAL, CKMB, CKMBINDEX, TROPONINI in the last 168 hours.  BNP (last 3 results) No results for input(s): BNP in the last 8760 hours.  ProBNP (last 3 results) No results for input(s): PROBNP in the last 8760 hours.  CBG: No results for input(s): GLUCAP in the last 168 hours.  Radiological Exams on Admission: CT ABDOMEN PELVIS WO CONTRAST  Result Date: 07/26/2021 CLINICAL DATA:  Abdomen pain with nausea vomiting EXAM: CT ABDOMEN AND PELVIS WITHOUT CONTRAST TECHNIQUE: Multidetector CT imaging of the abdomen and pelvis was performed following the standard protocol without IV contrast. COMPARISON:  CT 06/25/2021, 03/19/2021, 07/27/2008 FINDINGS: Lower chest: Lung bases demonstrate no acute consolidation or effusion. Normal cardiac size. Hepatobiliary: Status post cholecystectomy. No focal hepatic  abnormality. Similar extrahepatic biliary dilatation. Pancreas: Unremarkable. No pancreatic ductal dilatation or surrounding inflammatory changes. Spleen: Normal in size without focal abnormality. Adrenals/Urinary Tract: Adrenal glands are normal. Cysts in the right kidney measuring up to 6.5 cm. Punctate stone in the right kidney. No hydronephrosis or ureteral stone. The bladder is unremarkable Stomach/Bowel: The stomach is nonenlarged. Patient is status post ileocecal resection. Redemonstrated fluid-filled neoterminal ileum with mild wall thickening. Mildly dilated fluid-filled small bowel upstream to the area of bowel wall thickening. Mild wall thickening of the proximal transverse colon. Mild diverticular disease left colon Vascular/Lymphatic: Mild aortic atherosclerosis. No aneurysm. No suspicious nodes Reproductive: Uterus and bilateral adnexa are unremarkable. Other: Negative for pelvic effusion or free air Musculoskeletal: Degenerative changes. No acute osseous abnormality. IMPRESSION: 1. Status post ileocecal resection  with redemonstrated mild fluid distention and wall thickening of the neo terminal ileum. Some inflammatory changes in the fat surrounding the thickened neoterminal ileum, suspect for acute on chronic Crohn's disease. Mild fluid distension of distal upstream small bowel could reflect low-grade obstructive changes, this is increased compared to the exam 06/25/2021. Probable mild wall thickening/colitis of the transverse colon as well. 2. Nonobstructing right kidney stones Electronically Signed   By: Donavan Foil M.D.   On: 07/26/2021 15:56    EKG: Not obtained  Assessment/Plan Active Problems:   COPD (chronic obstructive pulmonary disease) (HCC)   Hypertension   Crohn's colitis ([place])   Chronic kidney disease (CKD), stage III (moderate) (HCC)   Bridget Huber is a 69 y.o. female with hx of Crohn's disease, COPD, CKD, hypertension, who presents with abdominal pain findings on CT of abdomen and pelvis consistent with acute Crohn's flare.  #Acute on chronic Crohn's disease #Abdominal pain CT findings consistent with Crohn's flare, patient has failed outpatient management with p.o. prednisone despite taking her maintenance azathioprine. - Message sent to notify GI of consultation in the a.m. per [facility] protocol - Continue methylprednisone 125 mg IV daily - Continue ceftriaxone and Flagyl for broad antibiotic coverage - Hold azathioprine in setting of acute flare - Clear liquid diet for time being - LR at 125 cc an hour - Pain meds as needed  #AKI Likely prerenal in setting of frequent emesis and diarrhea, status post 2 L LR bolus. - LR at 125 cc an hour - Trend BMP  #?UTI? Low suspicion overall, will await urine culture results.  #Chronic medical problems GERD-continue PPI and IV for  Code Status: DNR/DNI, confirmed with patient DVT Prophylaxis: Lovenox Family Communication: None Disposition Plan: Inpatient, MedSurg  Time spent: 38 min  Clarnce Flock MD/[facility]  Note:  This document was prepared using Systems analyst and may include unintentional dictation errors.

## 2021-07-26 NOTE — ED Provider Notes (Signed)
Patient admitted for Crohn's exacerbation.  CT scan reviewed and is consistent with Crohn's.  Possible low-grade obstructive changes noted.  Admit to medicine.  Flagyl added for GI coverage.   Duffy Bruce, MD 07/26/21 2019

## 2021-07-26 NOTE — ED Provider Notes (Signed)
[facility] Emergency Department Provider Note ____________________________________________   Event Date/Time   First MD Initiated Contact with Patient 07/26/21 1355     (approximate)  I have reviewed the triage vital signs and the nursing notes.  HISTORY  Chief Complaint Abdominal Pain   HPI Bridget Huber is a 70 y.o. femalewho presents to the ED for evaluation of abdominal pain, nausea, vomiting, diarrhea and urinary frequency.  Chart review indicates history of Crohn's disease.  CKD, COPD, HTN.  Seen here 1 month ago for similar presentation.  CT with wall thickening of the ileum concerning for Crohn's flare, and she was managed as an outpatient with prednisone.  Patient returns to the ED for evaluation of couple weeks of worsening lower abdominal discomfort.  She reports recurrent episodes of nonbloody nonbilious emesis and diarrhea, occasionally with hematochezia.  She reports urinary frequency over the past couple days without dysuria, hematuria, foul smell or incontinence.  She reports that the steroid taper that she got 1 month ago only helped for couple weeks, and her pain quickly returned.  Reporting up to 10/10 burning lower abdominal pain, nonradiating.  She denies any fevers, syncopal episodes or falls, but does report some presyncopal lightheaded dizziness.  Past Medical History:  Diagnosis Date   Anemia    Arthritis    Chronic kidney disease    kidney stones   COPD (chronic obstructive pulmonary disease) (HCC)    Crohn's disease ([place])    DVT (deep venous thrombosis) ([place])    Hypertension     Patient Active Problem List   Diagnosis Date Noted   Abdominal pain 03/19/2021   Acute Crohn's disease with intestinal obstruction ([place]) 03/19/2021   Tobacco abuse 03/19/2021   Iron deficiency anemia 03/19/2021   COPD (chronic obstructive pulmonary disease) (HCC)    Hypertension    Acute renal failure superimposed on stage 3a chronic kidney  disease ([place])    Hypokalemia     Past Surgical History:  Procedure Laterality Date   COLONOSCOPY N/A 05/31/2015   Procedure: COLONOSCOPY;  Surgeon: Manya Silvas, MD;  Location: [place];  Service: Endoscopy;  Laterality: N/A;   COLONOSCOPY WITH PROPOFOL N/A 03/21/2021   Procedure: COLONOSCOPY WITH PROPOFOL;  Surgeon: Lin Landsman, MD;  Location[facility] ENDOSCOPY;  Service: Gastroenterology;  Laterality: N/A;    Prior to Admission medications   Medication Sig Start Date End Date Taking? Authorizing Provider  acetaminophen (TYLENOL) 650 MG CR tablet Take 1,300 mg by mouth every 8 (eight) hours as needed for pain.    [provider]  azaTHIOprine (IMURAN) 50 MG tablet Take 100 mg by mouth daily.    [provider]  Calcium Carbonate-Vitamin D 600-400 MG-UNIT tablet Take 1 tablet by mouth 2 (two) times daily.    [provider]  Cholecalciferol 50 MCG (2000 UT) CAPS Take 2,000 Units by mouth daily.    [provider]  cyanocobalamin 1000 MCG tablet Take 1,000 mcg by mouth daily.    [provider]  ferrous sulfate 325 (65 FE) MG tablet Take 325 mg by mouth daily.    [provider]  inFLIXimab (REMICADE) 100 MG injection Inject into the vein. 07/17/21   [provider]  methylPREDNISolone sodium succinate (SOLU-MEDROL) 40 mg/mL injection Inject into the vein. 04/09/21   [provider]  Multiple Vitamin (MULTI-VITAMIN) tablet Take 1 tablet by mouth daily.    [provider]  omeprazole (PRILOSEC) 20 MG capsule Take 20 mg by mouth  daily.    [provider]  oxyCODONE-acetaminophen (PERCOCET) 5-325 MG tablet Take 1 tablet by mouth every 4 (four) hours as needed for severe pain. 06/25/21   Harvest Dark, MD  potassium chloride (KLOR-CON) 10 MEQ tablet Take by mouth. 03/28/21 03/28/22  [provider]  predniSONE (DELTASONE) 10 MG tablet Take 1 tablet (10 mg total) by mouth daily. Day 1-3:  take 4 tablets PO daily Day 4-6: take 3 tablets PO daily Day 7-9: take 2 tablets PO daily Day 10-12: take 1 tablet PO daily Patient not taking: Reported on 07/11/2021 06/25/21   Harvest Dark, MD    Allergies Aspirin  Family History  Problem Relation Age of Onset   Hypertension Brother    Diabetes Mellitus II Brother     Social History Social History   Tobacco Use   Smoking status: Every Day   Smokeless tobacco: Never  Substance Use Topics   Alcohol use: Not Currently   Drug use: Never   Review of Systems  Constitutional: No fever/chills.  Positive generalized weakness and presyncope. Eyes: No visual changes. ENT: No sore throat. Cardiovascular: Denies chest pain. Respiratory: Denies shortness of breath. Gastrointestinal: Positive for abdominal pain, nausea, vomiting and diarrhea.  No constipation. Genitourinary: Negative for dysuria. Positive for urinary frequency and urgency without dysuria. Musculoskeletal: Negative for back pain. Skin: Negative for rash. Neurological: Negative for headaches, focal weakness or numbness. ____________________________________________  PHYSICAL EXAM:  VITAL SIGNS: Vitals:   07/26/21 1423 07/26/21 1500  BP: 124/74 117/76  Pulse: (!) 102 99  Resp: 20 17  Temp:    SpO2: 98% 98%    Constitutional: Alert and oriented.  Appears uncomfortable but in no acute distress. Eyes: Conjunctivae are normal. PERRL. EOMI. Head: Atraumatic. Nose: No congestion/rhinnorhea. Mouth/Throat: Mucous membranes are dry.  Oropharynx non-erythematous. Neck: No stridor. No cervical spine tenderness to palpation. Cardiovascular: Normal rate, regular rhythm. Grossly normal heart sounds.  Good peripheral circulation. Respiratory: Normal respiratory effort.  No retractions. Lungs CTAB. Gastrointestinal: Soft , nondistended. No CVA tenderness. Suprapubic and RLQ tenderness with voluntary guarding.  Upper abdomen is benign. Musculoskeletal: No lower extremity  tenderness nor edema.  No joint effusions. No signs of acute trauma. Neurologic:  Normal speech and language. No gross focal neurologic deficits are appreciated.  Skin:  Skin is warm, dry and intact. No rash noted. Psychiatric: Mood and affect are normal. Speech and behavior are normal. ____________________________________________   LABS (all labs ordered are listed, but only abnormal results are displayed)  Labs Reviewed  COMPREHENSIVE METABOLIC PANEL - Abnormal; Notable for the following components:      Result Value   Chloride 113 (*)    CO2 17 (*)    Glucose, Bld 113 (*)    Creatinine, Ser 2.34 (*)    Calcium 8.7 (*)    Total Protein 6.3 (*)    Albumin 3.0 (*)    GFR, Estimated 22 (*)    All other components within normal limits  CBC - Abnormal; Notable for the following components:   Platelets 147 (*)    nRBC 0.4 (*)    All other components within normal limits  URINALYSIS, COMPLETE (UACMP) WITH MICROSCOPIC - Abnormal; Notable for the following components:   APPearance HAZY (*)    Hgb urine dipstick TRACE (*)    Ketones, ur TRACE (*)    Protein, ur 100 (*)    Leukocytes,Ua SMALL (*)    WBC, UA >50 (*)    Bacteria, UA MANY (*)  All other components within normal limits  URINE CULTURE  RESP PANEL BY RT-PCR (FLU A&B, COVID) ARPGX2  LIPASE, BLOOD   ____________________________________________  12 Lead EKG   ____________________________________________  RADIOLOGY  ED MD interpretation:    Official radiology report(s): No results found.  ____________________________________________   PROCEDURES and INTERVENTIONS  Procedure(s) performed (including Critical Care):  .1-3 Lead EKG Interpretation  Date/Time: 07/26/2021 3:22 PM Performed by: Vladimir Crofts, MD Authorized by: Vladimir Crofts, MD     Interpretation: abnormal     ECG rate:  104   ECG rate assessment: tachycardic     Rhythm: sinus tachycardia     Ectopy: none     Conduction: normal     Medications  ondansetron (ZOFRAN-ODT) disintegrating tablet 4 mg (4 mg Oral Given 07/26/21 1022)  lactated ringers bolus 2,000 mL (2,000 mLs Intravenous New Bag/Given 07/26/21 1449)  cefTRIAXone (ROCEPHIN) 1 g in sodium chloride 0.9 % 100 mL IVPB (0 g Intravenous Stopped 07/26/21 1449)  methylPREDNISolone sodium succinate (SOLU-MEDROL) 40 mg/mL injection 40 mg (40 mg Intravenous Given 07/26/21 1433)  morphine 4 MG/ML injection 4 mg (4 mg Intravenous Given 07/26/21 1431)    ____________________________________________   MDM / ED COURSE   70 year old woman with Crohn's disease presents to the ED with lower abdominal discomfort, N/V/D, and urinary symptoms concerning for acute cystitis superimposed on likely Crohn's flare causing AKI, requiring medical admission.  She is tachycardic but hemodynamically stable, likely due to hypovolemia in the setting of her GI fluid losses.  Blood work with a prerenal AKI and metabolic acidosis.  No evidence of pancreatitis.  No evidence of sepsis.  Urine is concerning for acute cystitis in the setting of her symptoms of urgency.  This we will send for culture and start on Rocephin.  CT abdomen/pelvis pending the time of signout to oncoming provider.  Anticipate medical admission for her Crohn's flare, AKI and acute cystitis.     ____________________________________________   FINAL CLINICAL IMPRESSION(S) / ED DIAGNOSES  Final diagnoses:  Acute cystitis without hematuria  AKI (acute kidney injury) [facility])     ED Discharge Orders     None        Burdell Peed   Note:  This document was prepared using Systems analyst and may include unintentional dictation errors.    Vladimir Crofts, MD 07/26/21 [phone]

## 2021-07-27 ENCOUNTER — Encounter: Payer: Self-pay | Admitting: Family Medicine

## 2021-07-27 DIAGNOSIS — K50012 Crohn's disease of small intestine with intestinal obstruction: Secondary | ICD-10-CM | POA: Diagnosis not present

## 2021-07-27 DIAGNOSIS — K5010 Crohn's disease of large intestine without complications: Secondary | ICD-10-CM

## 2021-07-27 LAB — BASIC METABOLIC PANEL
Anion gap: 7 (ref 5–15)
BUN: 28 mg/dL — ABNORMAL HIGH (ref 8–23)
CO2: 21 mmol/L — ABNORMAL LOW (ref 22–32)
Calcium: 7.6 mg/dL — ABNORMAL LOW (ref 8.9–10.3)
Chloride: 113 mmol/L — ABNORMAL HIGH (ref 98–111)
Creatinine, Ser: 1.83 mg/dL — ABNORMAL HIGH (ref 0.44–1.00)
GFR, Estimated: 29 mL/min — ABNORMAL LOW (ref >60)
Glucose, Bld: 105 mg/dL — ABNORMAL HIGH (ref 70–99)
Potassium: 3.3 mmol/L — ABNORMAL LOW (ref 3.5–5.1)
Sodium: 141 mmol/L (ref 135–145)

## 2021-07-27 LAB — CBC
HCT: 30.9 % — ABNORMAL LOW (ref 36.0–46.0)
Hemoglobin: 10.5 g/dL — ABNORMAL LOW (ref 12.0–15.0)
MCH: 31 pg (ref 26.0–34.0)
MCHC: 34 g/dL (ref 30.0–36.0)
MCV: 91.2 fL (ref 80.0–100.0)
Platelets: 216 10*3/uL (ref 150–400)
RBC: 3.39 MIL/uL — ABNORMAL LOW (ref 3.87–5.11)
RDW: 15.7 % — ABNORMAL HIGH (ref 11.5–15.5)
WBC: 4.1 10*3/uL (ref 4.0–10.5)
nRBC: 0 % (ref 0.0–0.2)

## 2021-07-27 MED ORDER — METHYLPREDNISOLONE SODIUM SUCC 40 MG IJ SOLR
40.0000 mg | Freq: Every day | INTRAMUSCULAR | Status: DC
  Administered 2021-07-28 – 2021-07-29 (×2): 40 mg via INTRAVENOUS
  Filled 2021-07-27 (×2): qty 1

## 2021-07-27 MED ORDER — PANTOPRAZOLE SODIUM 40 MG PO TBEC
40.0000 mg | DELAYED_RELEASE_TABLET | Freq: Every day | ORAL | Status: DC
  Administered 2021-07-28 – 2021-07-29 (×2): 40 mg via ORAL
  Filled 2021-07-27 (×2): qty 1

## 2021-07-27 NOTE — Consult Note (Signed)
[name], MD [address]  [place]  [place], [place] [address]  Main: [phone]  Fax: [phone] Pager: [phone]   Consultation  Referring Provider:     No ref. provider found Primary Care Physician:  Kirk Ruths, MD Primary Gastroenterologist:  Dr. Alice Reichert       Reason for Consultation:   Postop recurrence of small bowel Crohn's  Date of Admission:  07/26/2021 Date of Consultation:  07/27/2021         HPI:   Bridget Huber is a 70 y.o. female with history of small bowel Crohn's s/p ileal resection with ileocolonic anastomosis now with postop recurrence of Crohn's disease, ileal stricture at the neoterminal ileum, currently on Remicade 5 mg/kg every 8 weeks is admitted with small bowel obstruction.  Patient was told to go to ER by her primary GI doctor as she was experiencing severe abdominal pain associated with nausea and vomiting.  Labs revealed mild acidosis, AKI.  Patient is admitted for hydration and management of Crohn's exacerbation.  Her symptoms were not controlled on prednisone as outpatient.  She is started on antibiotics and Solu-Medrol during this admission and GI is consulted for further evaluation.  Per primary GIs note, patient has a surgical consultation with [facility] colorectal surgery pending to evaluate for resection of ileocolonic stricture.  Patient said she has appointment at [facility] on 9/9.  Most recent fecal calprotectin levels were 428 and CRP was 40 Last Remicade infusion was on 8/24, patient received 4 infusions so far  On interview, patient reports that she has been feeling significantly better since admission.  She is able to tolerate clear liquids.  Has not passed gas or flatus.  She denies any abdominal pain or distention.  NSAIDs: None  Antiplts/Anticoagulants/Anti thrombotics: None  GI Procedures:  Colonoscopy 03/21/2021 - Preparation of the colon was fair. - Non-patent end-to-side ileo-colonic anastomosis, characterized by  friable mucosa, inflammation, ulceration and severe stenosis. Dilated. Biopsied. - Normal mucosa in the entire examined colon. Biopsied. - The distal rectum and anal verge are normal on retroflexion view. - Non-bleeding external hemorrhoids.  DIAGNOSIS:  A. ILEOCOLONIC ANASTOMOSIS SITE; COLD BIOPSY:  - MARKED CHRONIC ACTIVE INFLAMMATION INVOLVING MUCOSA OF INDETERMINATE  TYPE, WITH ULCERATION.  - NEGATIVE FOR VIRAL CYTOPATHIC EFFECTS AND GRANULOMAS.  - NEGATIVE FOR DYSPLASIA AND MALIGNANCY.   B. RIGHT COLON; COLD BIOPSY:  - COLONIC MUCOSA WITH INTACT CRYPT ARCHITECTURE.  - NEGATIVE FOR ACTIVE INFLAMMATION, COLITIS, DYSPLASIA, AND MALIGNANCY.   C. LEFT COLON; COLD BIOPSY:  - COLONIC MUCOSA WITH INTACT CRYPT ARCHITECTURE.  - NEGATIVE FOR ACTIVE INFLAMMATION, COLITIS, DYSPLASIA, AND MALIGNANCY.   Past Medical History:  Diagnosis Date   Anemia    Arthritis    Chronic kidney disease    kidney stones   COPD (chronic obstructive pulmonary disease) (HCC)    Crohn's disease ([place])    DVT (deep venous thrombosis) ([place])    Hypertension     Past Surgical History:  Procedure Laterality Date   COLONOSCOPY N/A 05/31/2015   Procedure: COLONOSCOPY;  Surgeon: Manya Silvas, MD;  Location: [facility];  Service: Endoscopy;  Laterality: N/A;   COLONOSCOPY WITH PROPOFOL N/A 03/21/2021   Procedure: COLONOSCOPY WITH PROPOFOL;  Surgeon: Lin Landsman, MD;  Location: [facility] ENDOSCOPY;  Service: Gastroenterology;  Laterality: N/A;    Prior to Admission medications   Medication Sig Start Date End Date Taking? Authorizing Provider  azaTHIOprine (IMURAN) 50 MG tablet Take 50 mg by mouth daily.  Yes [provider]  Calcium Carbonate-Vitamin D 600-400 MG-UNIT tablet Take 1 tablet by mouth 2 (two) times daily.   Yes [provider]  Cholecalciferol 50 MCG (2000 UT) CAPS Take 2,000 Units by mouth daily.   Yes [provider]  cyanocobalamin 1000 MCG tablet Take 1,000  mcg by mouth daily.   Yes [provider]  ferrous sulfate 325 (65 FE) MG tablet Take 325 mg by mouth daily.   Yes [provider]  Multiple Vitamin (MULTI-VITAMIN) tablet Take 1 tablet by mouth daily.   Yes [provider]  omeprazole (PRILOSEC) 20 MG capsule Take 20 mg by mouth daily.   Yes [provider]  potassium chloride (KLOR-CON) 10 MEQ tablet Take 10 mEq by mouth daily. 03/28/21 03/28/22 Yes [provider]  acetaminophen (TYLENOL) 650 MG CR tablet Take 1,300 mg by mouth every 8 (eight) hours as needed for pain.    [provider]  inFLIXimab (REMICADE) 100 MG injection Inject into the vein. 07/17/21   [provider]  methylPREDNISolone sodium succinate (SOLU-MEDROL) 40 mg/mL injection Inject into the vein. 04/09/21   [provider]  oxyCODONE-acetaminophen (PERCOCET) 5-325 MG tablet Take 1 tablet by mouth every 4 (four) hours as needed for severe pain. Patient not taking: No sig reported 06/25/21   Harvest Dark, MD  predniSONE (DELTASONE) 10 MG tablet Take 1 tablet (10 mg total) by mouth daily. Day 1-3: take 4 tablets PO daily Day 4-6: take 3 tablets PO daily Day 7-9: take 2 tablets PO daily Day 10-12: take 1 tablet PO daily Patient not taking: No sig reported 06/25/21   Harvest Dark, MD    Current Facility-Administered Medications:    acetaminophen (TYLENOL) tablet 650 mg, 650 mg, Oral, Q6H PRN **OR** acetaminophen (TYLENOL) suppository 650 mg, 650 mg, Rectal, Q6H PRN, Clarnce Flock, MD   cefTRIAXone (ROCEPHIN) 1 g in sodium chloride 0.9 % 100 mL IVPB, 1 g, Intravenous, Q24H, Dione Plover, Annice Needy, MD   enoxaparin (LOVENOX) injection 30 mg, 30 mg, Subcutaneous, Q24H, Vira Blanco, RPH, 30 mg at 07/26/21 2231   lactated ringers infusion, , Intravenous, Continuous, Enzo Bi, MD, Last Rate: 125 mL/hr at 07/27/21 0635, New Bag at 07/27/21 0635   [START ON 07/28/2021] methylPREDNISolone sodium succinate  (SOLU-MEDROL) 40 mg/mL injection 40 mg, 40 mg, Intravenous, Daily, Amorie Rentz, Tally Due, MD   metroNIDAZOLE (FLAGYL) IVPB 500 mg, 500 mg, Intravenous, Q8H, Clarnce Flock, MD, Last Rate: 100 mL/hr at 07/27/21 0840, 500 mg at 07/27/21 0840   ondansetron (ZOFRAN) tablet 4 mg, 4 mg, Oral, Q6H PRN **OR** ondansetron (ZOFRAN) injection 4 mg, 4 mg, Intravenous, Q6H PRN, Clarnce Flock, MD   oxyCODONE (Oxy IR/ROXICODONE) immediate release tablet 5 mg, 5 mg, Oral, Q4H PRN, Clarnce Flock, MD   pantoprazole (PROTONIX) injection 40 mg, 40 mg, Intravenous, Q24H, Clarnce Flock, MD, 40 mg at 07/27/21 2703   sodium chloride flush (NS) 0.9 % injection 3 mL, 3 mL, Intravenous, Q12H, Clarnce Flock, MD, 3 mL at 07/27/21 5009   traZODone (DESYREL) tablet 25 mg, 25 mg, Oral, QHS PRN, Clarnce Flock, MD  Family History  Problem Relation Age of Onset   Hypertension Brother    Diabetes Mellitus II Brother      Social History   Tobacco Use   Smoking status: Every Day   Smokeless tobacco: Never  Substance Use Topics   Alcohol use: Not Currently   Drug use: Never    Allergies  as of 07/26/2021 - Review Complete 07/26/2021  Allergen Reaction Noted   Aspirin  05/30/2015    Review of Systems:    All systems reviewed and negative except where noted in HPI.   Physical Exam:  Vital signs in last 24 hours: Temp:  [97.9 F (36.6 C)-98.7 F (37.1 C)] 97.9 F (36.6 C) (09/03 0832) Pulse Rate:  [86-104] 96 (09/03 0832) Resp:  [17-20] 18 (09/03 0832) BP: (100-131)/(55-81) 131/75 (09/03 0832) SpO2:  [95 %-100 %] 95 % (09/03 7616)   General:   Pleasant, cooperative in NAD Head:  Normocephalic and atraumatic. Eyes:   No icterus.   Conjunctiva pink. PERRLA. Ears:  Normal auditory acuity. Neck:  Supple; no masses or thyroidomegaly Lungs: Respirations even and unlabored. Lungs clear to auscultation bilaterally.   No wheezes, crackles, or rhonchi.  Heart:  Regular rate and rhythm;   Without murmur, clicks, rubs or gallops Abdomen:  Soft, nondistended, nontender. Normal bowel sounds. No appreciable masses or hepatomegaly.  No rebound or guarding.  Rectal:  Not performed. Msk:  Symmetrical without gross deformities.  Strength normal Extremities:  Without edema, cyanosis or clubbing. Neurologic:  Alert and oriented x3;  grossly normal neurologically. Skin:  Intact without significant lesions or rashes. Psych:  Alert and cooperative. Normal affect.  LAB RESULTS: CBC Latest Ref Rng & Units 07/27/2021 07/26/2021 06/25/2021  WBC 4.0 - 10.5 K/uL 4.1 5.6 8.1  Hemoglobin 12.0 - 15.0 g/dL 10.5(L) 13.4 13.7  Hematocrit 36.0 - 46.0 % 30.9(L) 38.7 41.3  Platelets 150 - 400 K/uL 216 147(L) 218    BMET BMP Latest Ref Rng & Units 07/27/2021 07/26/2021 06/25/2021  Glucose 70 - 99 mg/dL 105(H) 113(H) 116(H)  BUN 8 - 23 mg/dL 28(H) QUANTITY NOT SUFFICIENT, UNABLE TO PERFORM TEST 20  Creatinine 0.44 - 1.00 mg/dL 1.83(H) 2.34(H) 2.05(H)  Sodium 135 - 145 mmol/L 141 139 138  Potassium 3.5 - 5.1 mmol/L 3.3(L) 3.6 4.1  Chloride 98 - 111 mmol/L 113(H) 113(H) 109  CO2 22 - 32 mmol/L 21(L) 17(L) 20(L)  Calcium 8.9 - 10.3 mg/dL 7.6(L) 8.7(L) 9.5    LFT Hepatic Function Latest Ref Rng & Units 07/26/2021 06/25/2021 03/19/2021  Total Protein 6.5 - 8.1 g/dL 6.3(L) 7.2 7.0  Albumin 3.5 - 5.0 g/dL 3.0(L) 3.7 3.8  AST 15 - 41 U/L '16 21 15  ' ALT 0 - 44 U/L '14 14 12  ' Alk Phosphatase 38 - 126 U/L 105 88 83  Total Bilirubin 0.3 - 1.2 mg/dL 1.1 0.9 0.8     STUDIES: CT ABDOMEN PELVIS WO CONTRAST  Result Date: 07/26/2021 CLINICAL DATA:  Abdomen pain with nausea vomiting EXAM: CT ABDOMEN AND PELVIS WITHOUT CONTRAST TECHNIQUE: Multidetector CT imaging of the abdomen and pelvis was performed following the standard protocol without IV contrast. COMPARISON:  CT 06/25/2021, 03/19/2021, 07/27/2008 FINDINGS: Lower chest: Lung bases demonstrate no acute consolidation or effusion. Normal cardiac size. Hepatobiliary: Status  post cholecystectomy. No focal hepatic abnormality. Similar extrahepatic biliary dilatation. Pancreas: Unremarkable. No pancreatic ductal dilatation or surrounding inflammatory changes. Spleen: Normal in size without focal abnormality. Adrenals/Urinary Tract: Adrenal glands are normal. Cysts in the right kidney measuring up to 6.5 cm. Punctate stone in the right kidney. No hydronephrosis or ureteral stone. The bladder is unremarkable Stomach/Bowel: The stomach is nonenlarged. Patient is status post ileocecal resection. Redemonstrated fluid-filled neoterminal ileum with mild wall thickening. Mildly dilated fluid-filled small bowel upstream to the area of bowel wall thickening. Mild wall thickening of the proximal transverse colon. Mild diverticular  disease left colon Vascular/Lymphatic: Mild aortic atherosclerosis. No aneurysm. No suspicious nodes Reproductive: Uterus and bilateral adnexa are unremarkable. Other: Negative for pelvic effusion or free air Musculoskeletal: Degenerative changes. No acute osseous abnormality. IMPRESSION: 1. Status post ileocecal resection with redemonstrated mild fluid distention and wall thickening of the neo terminal ileum. Some inflammatory changes in the fat surrounding the thickened neoterminal ileum, suspect for acute on chronic Crohn's disease. Mild fluid distension of distal upstream small bowel could reflect low-grade obstructive changes, this is increased compared to the exam 06/25/2021. Probable mild wall thickening/colitis of the transverse colon as well. 2. Nonobstructing right kidney stones Electronically Signed   By: Donavan Foil M.D.   On: 07/26/2021 15:56      Impression / Plan:   EVIANNA CHANDRAN is a 70 y.o. female with history of small bowel Crohn's s/p ileocecal resection previously maintained on azathioprine, now has postop recurrence of Crohn's disease with neoterminal ileal stricture who is admitted with small bowel obstruction.  Patient underwent colonoscopy  with dilation of the stricture in 02/2021.  Pathology confirmed severe chronic active ileitis.  From outpatient GI notes, it appears that patient is started on Remicade 5 mg/kg every [redacted] weeks along with azathioprine and her last dose was on 07/17/2021 which was her first maintenance dose.  Crohn's exacerbation Agree with Solu-Medrol 40 mg IV daily Continue empiric antibiotics Continue IV fluids and clear liquid diet Monitor electrolytes closely and replete as needed AKI is improving Patient has an appointment with [place] surgery on 9/9 If patient continues to feel better, will advance diet to full liquids only and switch to prednisone so that she can see colorectal surgeon on 9/9 Do not recommend repeat colonoscopy at this time, will be of low yield  Thank you for involving me in the care of this patient.  GI will follow along with you    LOS: 1 day   Sherri Sear, MD  07/27/2021, 11:01 AM    Note: This dictation was prepared with Dragon dictation along with smaller phrase technology. Any transcriptional errors that result from this process are unintentional.

## 2021-07-27 NOTE — Progress Notes (Signed)
PROGRESS NOTE    Bridget Huber  [id number] DOB: 01-01-1951 DOA: 07/26/2021 PCP: Kirk Ruths, MD  [id number]   Assessment & Plan:   Active Problems:   COPD (chronic obstructive pulmonary disease) (HCC)   Hypertension   Crohn's colitis ([place])   Chronic kidney disease (CKD), stage III (moderate) (HCC)   Bridget Huber is a 70 y.o. female with hx of Crohn's disease, COPD, CKD, hypertension, who presents with abdominal pain.   Patient was seen in the ED last month, she was treated with IV fluids, pain medicines, and discharged with p.o. prednisone.  Pt reported she received some relief from the prednisone but that it did not last.  Over the past days to weeks she has had increasingly worse abdominal pain located primarily in her right lower quadrant.  In addition she has had nausea, vomiting, diarrhea.   She denies any bright red blood per rectum, though does note that her stool has looked like coffee grounds.    She reports she has been on azathioprine for many years without any flareups, her last 1 was in the early 2000's when she had a bowel resection.   #Acute on chronic Crohn's disease --presented with abdominal pain and diarrhea.   --CT findings consistent with Crohn's flare, patient has failed outpatient management with p.o. prednisone despite taking her maintenance azathioprine. --started on IV solumedrol, ceftriaxone, flagyl, and IVF Plan: --cont IV solumedrol 40 mg daily --cont empiric vanc and flagyl --cont IVF at reduced 75 ml/hr --cont clear liquid diet - Hold azathioprine in setting of acute flare --management per GI  #AKI Likely prerenal in setting of frequent emesis and diarrhea, status post 2 L LR bolus. --cont IVF at reduced 75 ml/hr   #?UTI? --already on empiric abx --f/u urine cx   GERD --switch to oral PPI   DVT prophylaxis: Lovenox SQ Code Status: DNR  Family Communication:  Level of care: Med-Surg Dispo:   The patient is from:  home Anticipated d/c is to: home Anticipated d/c date is: 2-3 days Patient currently is not medically ready to d/c due to: crohn's flare   Subjective and Interval History:  Abdominal pain and diarrhea resolved.  Pt tolerating clear liquid diet.  Normal urination.   Objective: Vitals:   07/27/21 0556 07/27/21 0832 07/27/21 1625 07/27/21 2036  BP: (!) 100/55 131/75 110/60 102/60  Pulse: 86 96 81 80  Resp: '20 18 18 20  '$ Temp: 98 F (36.7 C) 97.9 F (36.6 C) 98 F (36.7 C) 98.9 F (37.2 C)  TempSrc: Oral     SpO2: 99% 95% 100% 98%  Weight:      Height:        Intake/Output Summary (Last 24 hours) at 07/27/2021 2221 Last data filed at 07/27/2021 1530 Gross per 24 hour  Intake 3156.25 ml  Output 400 ml  Net 2756.25 ml   Filed Weights   07/26/21 1012 07/26/21 1017  Weight: 67.6 kg 67.6 kg    Examination:   Constitutional: NAD, AAOx3 HEENT: conjunctivae and lids normal, EOMI CV: No cyanosis.   RESP: normal respiratory effort, on RA Extremities: No effusions, edema in BLE SKIN: warm, dry Neuro: II - XII grossly intact.   Psych: Normal mood and affect.  Appropriate judgement and reason   Data Reviewed: I have personally reviewed following labs and imaging studies  CBC: Recent Labs  Lab 07/26/21 1106 07/27/21 0427  WBC 5.6 4.1  HGB 13.4 10.5*  HCT 38.7 30.9*  MCV 89.8 91.2  PLT 147* [id number]   Basic Metabolic Panel: Recent Labs  Lab 07/26/21 1106 07/27/21 0427  NA 139 141  K 3.6 3.3*  CL 113* 113*  CO2 17* 21*  GLUCOSE 113* 105*  BUN QUANTITY NOT SUFFICIENT, UNABLE TO PERFORM TEST 28*  CREATININE 2.34* 1.83*  CALCIUM 8.7* 7.6*   GFR: Estimated Creatinine Clearance: 25 mL/min (A) (by C-G formula based on SCr of 1.83 mg/dL (H)). Liver Function Tests: Recent Labs  Lab 07/26/21 1106  AST 16  ALT 14  ALKPHOS 105  BILITOT 1.1  PROT 6.3*  ALBUMIN 3.0*   Recent Labs  Lab 07/26/21 1106  LIPASE 24   No results for input(s): AMMONIA in the last 168  hours. Coagulation Profile: No results for input(s): INR, PROTIME in the last 168 hours. Cardiac Enzymes: No results for input(s): CKTOTAL, CKMB, CKMBINDEX, TROPONINI in the last 168 hours. BNP (last 3 results) No results for input(s): PROBNP in the last 8760 hours. HbA1C: No results for input(s): HGBA1C in the last 72 hours. CBG: No results for input(s): GLUCAP in the last 168 hours. Lipid Profile: No results for input(s): CHOL, HDL, LDLCALC, TRIG, CHOLHDL, LDLDIRECT in the last 72 hours. Thyroid Function Tests: No results for input(s): TSH, T4TOTAL, FREET4, T3FREE, THYROIDAB in the last 72 hours. Anemia Panel: No results for input(s): VITAMINB12, FOLATE, FERRITIN, TIBC, IRON, RETICCTPCT in the last 72 hours. Sepsis Labs: No results for input(s): PROCALCITON, LATICACIDVEN in the last 168 hours.  Recent Results (from the past 240 hour(s))  Resp Panel by RT-PCR (Flu A&B, Covid) Nasopharyngeal Swab     Status: None   Collection Time: 07/26/21  2:16 PM   Specimen: Nasopharyngeal Swab; Nasopharyngeal(NP) swabs in vial transport medium  Result Value Ref Range Status   SARS Coronavirus 2 by RT PCR NEGATIVE NEGATIVE Final    Comment: (NOTE) SARS-CoV-2 target nucleic acids are NOT DETECTED.  The SARS-CoV-2 RNA is generally detectable in upper respiratory specimens during the acute phase of infection. The lowest concentration of SARS-CoV-2 viral copies this assay can detect is 138 copies/mL. A negative result does not preclude SARS-Cov-2 infection and should not be used as the sole basis for treatment or other patient management decisions. A negative result may occur with  improper specimen collection/handling, submission of specimen other than nasopharyngeal swab, presence of viral mutation(s) within the areas targeted by this assay, and inadequate number of viral copies(<138 copies/mL). A negative result must be combined with clinical observations, patient history, and  epidemiological information. The expected result is Negative.  Fact Sheet for Patients:  EntrepreneurPulse.com.au  Fact Sheet for Healthcare Providers:  IncredibleEmployment.be  This test is no t yet approved or cleared by the [place] FDA and  has been authorized for detection and/or diagnosis of SARS-CoV-2 by FDA under an Emergency Use Authorization (EUA). This EUA will remain  in effect (meaning this test can be used) for the duration of the COVID-19 declaration under Section 564(b)(1) of the Act, 21 U.S.C.section 360bbb-3(b)(1), unless the authorization is terminated  or revoked sooner.       Influenza A by PCR NEGATIVE NEGATIVE Final   Influenza B by PCR NEGATIVE NEGATIVE Final    Comment: (NOTE) The Xpert Xpress SARS-CoV-2/FLU/RSV plus assay is intended as an aid in the diagnosis of influenza from Nasopharyngeal swab specimens and should not be used as a sole basis for treatment. Nasal washings and aspirates are unacceptable for Xpert Xpress SARS-CoV-2/FLU/RSV testing.  Fact Sheet for Patients: EntrepreneurPulse.com.au  Fact Sheet for  Healthcare Providers: IncredibleEmployment.be  This test is not yet approved or cleared by the [place] and has been authorized for detection and/or diagnosis of SARS-CoV-2 by FDA under an Emergency Use Authorization (EUA). This EUA will remain in effect (meaning this test can be used) for the duration of the COVID-19 declaration under Section 564(b)(1) of the Act, 21 U.S.C. section 360bbb-3(b)(1), unless the authorization is terminated or revoked.  Performed at [facility], [address] [place], [place] [address]       Radiology Studies: CT ABDOMEN PELVIS WO CONTRAST  Result Date: 07/26/2021 CLINICAL DATA:  Abdomen pain with nausea vomiting EXAM: CT ABDOMEN AND PELVIS WITHOUT CONTRAST TECHNIQUE: Multidetector CT imaging of the  abdomen and pelvis was performed following the standard protocol without IV contrast. COMPARISON:  CT 06/25/2021, 03/19/2021, 07/27/2008 FINDINGS: Lower chest: Lung bases demonstrate no acute consolidation or effusion. Normal cardiac size. Hepatobiliary: Status post cholecystectomy. No focal hepatic abnormality. Similar extrahepatic biliary dilatation. Pancreas: Unremarkable. No pancreatic ductal dilatation or surrounding inflammatory changes. Spleen: Normal in size without focal abnormality. Adrenals/Urinary Tract: Adrenal glands are normal. Cysts in the right kidney measuring up to 6.5 cm. Punctate stone in the right kidney. No hydronephrosis or ureteral stone. The bladder is unremarkable Stomach/Bowel: The stomach is nonenlarged. Patient is status post ileocecal resection. Redemonstrated fluid-filled neoterminal ileum with mild wall thickening. Mildly dilated fluid-filled small bowel upstream to the area of bowel wall thickening. Mild wall thickening of the proximal transverse colon. Mild diverticular disease left colon Vascular/Lymphatic: Mild aortic atherosclerosis. No aneurysm. No suspicious nodes Reproductive: Uterus and bilateral adnexa are unremarkable. Other: Negative for pelvic effusion or free air Musculoskeletal: Degenerative changes. No acute osseous abnormality. IMPRESSION: 1. Status post ileocecal resection with redemonstrated mild fluid distention and wall thickening of the neo terminal ileum. Some inflammatory changes in the fat surrounding the thickened neoterminal ileum, suspect for acute on chronic Crohn's disease. Mild fluid distension of distal upstream small bowel could reflect low-grade obstructive changes, this is increased compared to the exam 06/25/2021. Probable mild wall thickening/colitis of the transverse colon as well. 2. Nonobstructing right kidney stones Electronically Signed   By: Donavan Foil M.D.   On: 07/26/2021 15:56     Scheduled Meds:  enoxaparin (LOVENOX) injection  30  mg Subcutaneous Q24H   [START ON 07/28/2021] methylPREDNISolone (SOLU-MEDROL) injection  40 mg Intravenous Daily   pantoprazole (PROTONIX) IV  40 mg Intravenous Q24H   sodium chloride flush  3 mL Intravenous Q12H   Continuous Infusions:  cefTRIAXone (ROCEPHIN)  IV 1 g (07/27/21 1559)   lactated ringers 75 mL/hr at 07/27/21 1112   metronidazole 500 mg (07/27/21 1659)     LOS: 1 day     Enzo Bi, MD [facility] If 7PM-7AM, please contact night-coverage 07/27/2021, 10:21 PM

## 2021-07-27 NOTE — Plan of Care (Signed)
No acute events during the night. VSS. NAD noted. Denies pain. LR @ 125. Tolerating clears well.  Problem: Education: Goal: Knowledge of General Education information will improve Description: Including pain rating scale, medication(s)/side effects and non-pharmacologic comfort measures Outcome: Progressing   Problem: Health Behavior/Discharge Planning: Goal: Ability to manage health-related needs will improve Outcome: Progressing   Problem: Clinical Measurements: Goal: Ability to maintain clinical measurements within normal limits will improve Outcome: Progressing Goal: Will remain free from infection Outcome: Progressing Goal: Diagnostic test results will improve Outcome: Progressing Goal: Respiratory complications will improve Outcome: Progressing Goal: Cardiovascular complication will be avoided Outcome: Progressing   Problem: Activity: Goal: Risk for activity intolerance will decrease Outcome: Progressing   Problem: Nutrition: Goal: Adequate nutrition will be maintained Outcome: Progressing   Problem: Coping: Goal: Level of anxiety will decrease Outcome: Progressing   Problem: Elimination: Goal: Will not experience complications related to bowel motility Outcome: Progressing Goal: Will not experience complications related to urinary retention Outcome: Progressing   Problem: Pain Managment: Goal: General experience of comfort will improve Outcome: Progressing   Problem: Safety: Goal: Ability to remain free from injury will improve Outcome: Progressing   Problem: Skin Integrity: Goal: Risk for impaired skin integrity will decrease Outcome: Progressing

## 2021-07-28 DIAGNOSIS — K50012 Crohn's disease of small intestine with intestinal obstruction: Secondary | ICD-10-CM | POA: Diagnosis not present

## 2021-07-28 LAB — BASIC METABOLIC PANEL
Anion gap: 6 (ref 5–15)
BUN: 21 mg/dL (ref 8–23)
CO2: 20 mmol/L — ABNORMAL LOW (ref 22–32)
Calcium: 7.5 mg/dL — ABNORMAL LOW (ref 8.9–10.3)
Chloride: 111 mmol/L (ref 98–111)
Creatinine, Ser: 1.71 mg/dL — ABNORMAL HIGH (ref 0.44–1.00)
GFR, Estimated: 31 mL/min — ABNORMAL LOW (ref >60)
Glucose, Bld: 99 mg/dL (ref 70–99)
Potassium: 2.8 mmol/L — ABNORMAL LOW (ref 3.5–5.1)
Sodium: 137 mmol/L (ref 135–145)

## 2021-07-28 LAB — CBC
HCT: 30.2 % — ABNORMAL LOW (ref 36.0–46.0)
Hemoglobin: 10.3 g/dL — ABNORMAL LOW (ref 12.0–15.0)
MCH: 31 pg (ref 26.0–34.0)
MCHC: 34.1 g/dL (ref 30.0–36.0)
MCV: 91 fL (ref 80.0–100.0)
Platelets: 218 10*3/uL (ref 150–400)
RBC: 3.32 MIL/uL — ABNORMAL LOW (ref 3.87–5.11)
RDW: 15.6 % — ABNORMAL HIGH (ref 11.5–15.5)
WBC: 5.4 10*3/uL (ref 4.0–10.5)
nRBC: 0.4 % — ABNORMAL HIGH (ref 0.0–0.2)

## 2021-07-28 LAB — MAGNESIUM: Magnesium: 1.1 mg/dL — ABNORMAL LOW (ref 1.7–2.4)

## 2021-07-28 MED ORDER — MAGNESIUM SULFATE 4 GM/100ML IV SOLN
4.0000 g | INTRAVENOUS | Status: AC
  Administered 2021-07-28 (×2): 4 g via INTRAVENOUS
  Filled 2021-07-28 (×2): qty 100

## 2021-07-28 MED ORDER — POTASSIUM CHLORIDE CRYS ER 20 MEQ PO TBCR
40.0000 meq | EXTENDED_RELEASE_TABLET | ORAL | Status: AC
  Administered 2021-07-28 (×2): 40 meq via ORAL
  Filled 2021-07-28 (×2): qty 2

## 2021-07-28 NOTE — Progress Notes (Signed)
PROGRESS NOTE    Bridget Huber  [id number] DOB: [birth year] DOA: 07/26/2021 PCP: Kirk Ruths, MD  [id number]   Assessment & Plan:   Active Problems:   COPD (chronic obstructive pulmonary disease) (HCC)   Hypertension   Crohn's colitis ([place])   Chronic kidney disease (CKD), stage III (moderate) (HCC)   Bridget Huber is a 70 y.o. female with hx of Crohn's disease, COPD, CKD, hypertension, who presents with abdominal pain.   Patient was seen in the ED last month, she was treated with IV fluids, pain medicines, and discharged with p.o. prednisone.  Pt reported she received some relief from the prednisone but that it did not last.  Over the past days to weeks she has had increasingly worse abdominal pain located primarily in her right lower quadrant.  In addition she has had nausea, vomiting, diarrhea.   She denies any bright red blood per rectum, though does note that her stool has looked like coffee grounds.    She reports she has been on azathioprine for many years without any flareups, her last 1 was in the early 2000's when she had a bowel resection.   #Acute on chronic Crohn's disease --presented with abdominal pain and diarrhea.   --CT findings consistent with Crohn's flare, patient has failed outpatient management with p.o. prednisone despite taking her maintenance azathioprine. --started on IV solumedrol, ceftriaxone, flagyl, and IVF Plan: --cont IV solumedrol 40 mg daily --cont empiric vanc and flagyl --d/c IVF today --advance to Full liquid diet today - Hold azathioprine in setting of acute flare --management per GI  #AKI Likely prerenal in setting of frequent emesis and diarrhea, status post 2 L LR bolus. --d/c IVF today --encourage oral hydration   # possible UTI from E coli --cont ceftriaxone   GERD --cont PPI   DVT prophylaxis: Lovenox SQ Code Status: DNR  Family Communication:  Level of care: Med-Surg Dispo:   The patient is from:  home Anticipated d/c is to: home Anticipated d/c date is: 1-2 days Patient currently is not medically ready to d/c due to: crohn's flare   Subjective and Interval History:  Started having diarrhea again, no blood.  No abdominal pain.     Objective: Vitals:   07/28/21 0500 07/28/21 0855 07/28/21 1123 07/28/21 1552  BP: 114/62 120/62 121/63 (!) 100/52  Pulse: 67 88 75 78  Resp: '15 16 18 16  '$ Temp: 98 F (36.7 C) 98 F (36.7 C) 97.8 F (36.6 C) 98.4 F (36.9 C)  TempSrc: Oral     SpO2: 100% 100% 99% 97%  Weight:      Height:        Intake/Output Summary (Last 24 hours) at 07/28/2021 1641 Last data filed at 07/28/2021 1500 Gross per 24 hour  Intake 1432.25 ml  Output --  Net 1432.25 ml   Filed Weights   07/26/21 1012 07/26/21 1017  Weight: 67.6 kg 67.6 kg    Examination:   Constitutional: NAD, AAOx3 HEENT: conjunctivae and lids normal, EOMI CV: No cyanosis.   RESP: normal respiratory effort, on RA Extremities: No effusions, edema in BLE SKIN: warm, dry Neuro: II - XII grossly intact.   Psych: Normal mood and affect.  Appropriate judgement and reason   Data Reviewed: I have personally reviewed following labs and imaging studies  CBC: Recent Labs  Lab 07/26/21 1106 07/27/21 0427 07/28/21 0454  WBC 5.6 4.1 5.4  HGB 13.4 10.5* 10.3*  HCT 38.7 30.9* 30.2*  MCV 89.8 91.2 91.0  PLT 147* 216 [id number]   Basic Metabolic Panel: Recent Labs  Lab 07/26/21 1106 07/27/21 0427 07/28/21 0454  NA 139 141 137  K 3.6 3.3* 2.8*  CL 113* 113* 111  CO2 17* 21* 20*  GLUCOSE 113* 105* 99  BUN QUANTITY NOT SUFFICIENT, UNABLE TO PERFORM TEST 28* 21  CREATININE 2.34* 1.83* 1.71*  CALCIUM 8.7* 7.6* 7.5*  MG  --   --  1.1*   GFR: Estimated Creatinine Clearance: 26.8 mL/min (A) (by C-G formula based on SCr of 1.71 mg/dL (H)). Liver Function Tests: Recent Labs  Lab 07/26/21 1106  AST 16  ALT 14  ALKPHOS 105  BILITOT 1.1  PROT 6.3*  ALBUMIN 3.0*   Recent Labs  Lab  07/26/21 1106  LIPASE 24   No results for input(s): AMMONIA in the last 168 hours. Coagulation Profile: No results for input(s): INR, PROTIME in the last 168 hours. Cardiac Enzymes: No results for input(s): CKTOTAL, CKMB, CKMBINDEX, TROPONINI in the last 168 hours. BNP (last 3 results) No results for input(s): PROBNP in the last 8760 hours. HbA1C: No results for input(s): HGBA1C in the last 72 hours. CBG: No results for input(s): GLUCAP in the last 168 hours. Lipid Profile: No results for input(s): CHOL, HDL, LDLCALC, TRIG, CHOLHDL, LDLDIRECT in the last 72 hours. Thyroid Function Tests: No results for input(s): TSH, T4TOTAL, FREET4, T3FREE, THYROIDAB in the last 72 hours. Anemia Panel: No results for input(s): VITAMINB12, FOLATE, FERRITIN, TIBC, IRON, RETICCTPCT in the last 72 hours. Sepsis Labs: No results for input(s): PROCALCITON, LATICACIDVEN in the last 168 hours.  Recent Results (from the past 240 hour(s))  Urine Culture     Status: Abnormal (Preliminary result)   Collection Time: 07/26/21  2:14 PM   Specimen: Urine, Random  Result Value Ref Range Status   Specimen Description   Final    URINE, RANDOM Performed at [facility], [address] [place], [place] [address]    Special Requests   Final    NONE Performed at [facility], [address] [place], [place] [address]    Culture (A)  Final    >=100,000 COLONIES/mL ESCHERICHIA COLI SUSCEPTIBILITIES TO FOLLOW Performed at [facility], [place] [address] [place], [place] [address]    Report Status PENDING  Incomplete  Resp Panel by RT-PCR (Flu A&B, Covid) Nasopharyngeal Swab     Status: None   Collection Time: 07/26/21  2:16 PM   Specimen: Nasopharyngeal Swab; Nasopharyngeal(NP) swabs in vial transport medium  Result Value Ref Range Status   SARS Coronavirus 2 by RT PCR NEGATIVE NEGATIVE Final    Comment: (NOTE) SARS-CoV-2 target nucleic acids are NOT DETECTED.  The SARS-CoV-2 RNA  is generally detectable in upper respiratory specimens during the acute phase of infection. The lowest concentration of SARS-CoV-2 viral copies this assay can detect is 138 copies/mL. A negative result does not preclude SARS-Cov-2 infection and should not be used as the sole basis for treatment or other patient management decisions. A negative result may occur with  improper specimen collection/handling, submission of specimen other than nasopharyngeal swab, presence of viral mutation(s) within the areas targeted by this assay, and inadequate number of viral copies(<138 copies/mL). A negative result must be combined with clinical observations, patient history, and epidemiological information. The expected result is Negative.  Fact Sheet for Patients:  EntrepreneurPulse.com.au  Fact Sheet for Healthcare Providers:  IncredibleEmployment.be  This test is no t yet approved or cleared by the [place] FDA  and  has been authorized for detection and/or diagnosis of SARS-CoV-2 by FDA under an Emergency Use Authorization (EUA). This EUA will remain  in effect (meaning this test can be used) for the duration of the COVID-19 declaration under Section 564(b)(1) of the Act, 21 U.S.C.section 360bbb-3(b)(1), unless the authorization is terminated  or revoked sooner.       Influenza A by PCR NEGATIVE NEGATIVE Final   Influenza B by PCR NEGATIVE NEGATIVE Final    Comment: (NOTE) The Xpert Xpress SARS-CoV-2/FLU/RSV plus assay is intended as an aid in the diagnosis of influenza from Nasopharyngeal swab specimens and should not be used as a sole basis for treatment. Nasal washings and aspirates are unacceptable for Xpert Xpress SARS-CoV-2/FLU/RSV testing.  Fact Sheet for Patients: EntrepreneurPulse.com.au  Fact Sheet for Healthcare Providers: IncredibleEmployment.be  This test is not yet approved or cleared by the  [place] FDA and has been authorized for detection and/or diagnosis of SARS-CoV-2 by FDA under an Emergency Use Authorization (EUA). This EUA will remain in effect (meaning this test can be used) for the duration of the COVID-19 declaration under Section 564(b)(1) of the Act, 21 U.S.C. section 360bbb-3(b)(1), unless the authorization is terminated or revoked.  Performed at [facility], [address], [place], [place] [address]       Radiology Studies: No results found.   Scheduled Meds:  enoxaparin (LOVENOX) injection  30 mg Subcutaneous Q24H   methylPREDNISolone (SOLU-MEDROL) injection  40 mg Intravenous Daily   pantoprazole  40 mg Oral Daily   sodium chloride flush  3 mL Intravenous Q12H   Continuous Infusions:  cefTRIAXone (ROCEPHIN)  IV 1 g (07/27/21 1559)   metronidazole 500 mg (07/28/21 1601)     LOS: 2 days     Enzo Bi, MD [facility] If 7PM-7AM, please contact night-coverage 07/28/2021, 4:41 PM

## 2021-07-28 NOTE — Plan of Care (Signed)
No acute events during the night. NAD noted. VSS. LR @ 75. No complaints of pain.  Problem: Education: Goal: Knowledge of General Education information will improve Description: Including pain rating scale, medication(s)/side effects and non-pharmacologic comfort measures Outcome: Progressing   Problem: Health Behavior/Discharge Planning: Goal: Ability to manage health-related needs will improve Outcome: Progressing   Problem: Clinical Measurements: Goal: Ability to maintain clinical measurements within normal limits will improve Outcome: Progressing Goal: Will remain free from infection Outcome: Progressing Goal: Diagnostic test results will improve Outcome: Progressing Goal: Respiratory complications will improve Outcome: Progressing Goal: Cardiovascular complication will be avoided Outcome: Progressing   Problem: Activity: Goal: Risk for activity intolerance will decrease Outcome: Progressing   Problem: Nutrition: Goal: Adequate nutrition will be maintained Outcome: Progressing   Problem: Coping: Goal: Level of anxiety will decrease Outcome: Progressing   Problem: Elimination: Goal: Will not experience complications related to bowel motility Outcome: Progressing Goal: Will not experience complications related to urinary retention Outcome: Progressing   Problem: Pain Managment: Goal: General experience of comfort will improve Outcome: Progressing   Problem: Safety: Goal: Ability to remain free from injury will improve Outcome: Progressing   Problem: Skin Integrity: Goal: Risk for impaired skin integrity will decrease Outcome: Progressing

## 2021-07-28 NOTE — Progress Notes (Signed)
Bridget Darby, MD [address]  [place]  [place], [place] [address]  Main: [phone]  Fax: [phone] Pager: [phone]   Subjective: No acute events overnight.  Patient wants her diet to be advanced.  She reports having bowel movements.  She denies any abdominal pain, nausea or vomiting    Objective: Vital signs in last 24 hours: Vitals:   07/27/21 2339 07/28/21 0500 07/28/21 0855 07/28/21 1123  BP: 116/68 114/62 120/62 121/63  Pulse: 76 67 88 75  Resp: '19 15 16 18  '$ Temp: 98.3 F (36.8 C) 98 F (36.7 C) 98 F (36.7 C) 97.8 F (36.6 C)  TempSrc:  Oral    SpO2: 99% 100% 100% 99%  Weight:      Height:       Weight change:   Intake/Output Summary (Last 24 hours) at 07/28/2021 1152 Last data filed at 07/28/2021 0816 Gross per 24 hour  Intake 2304.43 ml  Output --  Net 2304.43 ml     Exam: Heart:: Regular rate and rhythm, S1S2 present, or without murmur or extra heart sounds Lungs: normal and clear to auscultation Abdomen: soft, nontender, normal bowel sounds   Lab Results: CBC Latest Ref Rng & Units 07/28/2021 07/27/2021 07/26/2021  WBC 4.0 - 10.5 K/uL 5.4 4.1 5.6  Hemoglobin 12.0 - 15.0 g/dL 10.3(L) 10.5(L) 13.4  Hematocrit 36.0 - 46.0 % 30.2(L) 30.9(L) 38.7  Platelets 150 - 400 K/uL 218 216 147(L)   CMP Latest Ref Rng & Units 07/28/2021 07/27/2021 07/26/2021  Glucose 70 - 99 mg/dL 99 105(H) 113(H)  BUN 8 - 23 mg/dL 21 28(H) QUANTITY NOT SUFFICIENT, UNABLE TO PERFORM TEST  Creatinine 0.44 - 1.00 mg/dL 1.71(H) 1.83(H) 2.34(H)  Sodium 135 - 145 mmol/L 137 141 139  Potassium 3.5 - 5.1 mmol/L 2.8(L) 3.3(L) 3.6  Chloride 98 - 111 mmol/L 111 113(H) 113(H)  CO2 22 - 32 mmol/L 20(L) 21(L) 17(L)  Calcium 8.9 - 10.3 mg/dL 7.5(L) 7.6(L) 8.7(L)  Total Protein 6.5 - 8.1 g/dL - - 6.3(L)  Total Bilirubin 0.3 - 1.2 mg/dL - - 1.1  Alkaline Phos 38 - 126 U/L - - 105  AST 15 - 41 U/L - - 16  ALT 0 - 44 U/L - - 14    Micro Results: Recent Results (from the past  240 hour(s))  Urine Culture     Status: Abnormal (Preliminary result)   Collection Time: 07/26/21  2:14 PM   Specimen: Urine, Random  Result Value Ref Range Status   Specimen Description   Final    URINE, RANDOM Performed at [facility], [address], [place], [place] [address]    Special Requests   Final    NONE Performed at [facility] [address], [place], [place] [address]    Culture (A)  Final    >=100,000 COLONIES/mL ESCHERICHIA COLI SUSCEPTIBILITIES TO FOLLOW Performed at [facility], [place] [address], [place], [place] [address]    Report Status PENDING  Incomplete  Resp Panel by RT-PCR (Flu A&B, Covid) Nasopharyngeal Swab     Status: None   Collection Time: 07/26/21  2:16 PM   Specimen: Nasopharyngeal Swab; Nasopharyngeal(NP) swabs in vial transport medium  Result Value Ref Range Status   SARS Coronavirus 2 by RT PCR NEGATIVE NEGATIVE Final    Comment: (NOTE) SARS-CoV-2 target nucleic acids are NOT DETECTED.  The SARS-CoV-2 RNA is generally detectable in upper respiratory specimens during the acute phase of infection. The lowest  concentration of SARS-CoV-2 viral copies this assay can detect is 138 copies/mL. A negative result does not preclude SARS-Cov-2 infection and should not be used as the sole basis for treatment or other patient management decisions. A negative result may occur with  improper specimen collection/handling, submission of specimen other than nasopharyngeal swab, presence of viral mutation(s) within the areas targeted by this assay, and inadequate number of viral copies(<138 copies/mL). A negative result must be combined with clinical observations, patient history, and epidemiological information. The expected result is Negative.  Fact Sheet for Patients:  EntrepreneurPulse.com.au  Fact Sheet for Healthcare Providers:  IncredibleEmployment.be  This test is no t yet approved or  cleared by the [place] FDA and  has been authorized for detection and/or diagnosis of SARS-CoV-2 by FDA under an Emergency Use Authorization (EUA). This EUA will remain  in effect (meaning this test can be used) for the duration of the COVID-19 declaration under Section 564(b)(1) of the Act, 21 U.S.C.section 360bbb-3(b)(1), unless the authorization is terminated  or revoked sooner.       Influenza A by PCR NEGATIVE NEGATIVE Final   Influenza B by PCR NEGATIVE NEGATIVE Final    Comment: (NOTE) The Xpert Xpress SARS-CoV-2/FLU/RSV plus assay is intended as an aid in the diagnosis of influenza from Nasopharyngeal swab specimens and should not be used as a sole basis for treatment. Nasal washings and aspirates are unacceptable for Xpert Xpress SARS-CoV-2/FLU/RSV testing.  Fact Sheet for Patients: EntrepreneurPulse.com.au  Fact Sheet for Healthcare Providers: IncredibleEmployment.be  This test is not yet approved or cleared by the [place] FDA and has been authorized for detection and/or diagnosis of SARS-CoV-2 by FDA under an Emergency Use Authorization (EUA). This EUA will remain in effect (meaning this test can be used) for the duration of the COVID-19 declaration under Section 564(b)(1) of the Act, 21 U.S.C. section 360bbb-3(b)(1), unless the authorization is terminated or revoked.  Performed at [facility], [address] [place], [place] [address]    Studies/Results: CT ABDOMEN PELVIS WO CONTRAST  Result Date: 07/26/2021 CLINICAL DATA:  Abdomen pain with nausea vomiting EXAM: CT ABDOMEN AND PELVIS WITHOUT CONTRAST TECHNIQUE: Multidetector CT imaging of the abdomen and pelvis was performed following the standard protocol without IV contrast. COMPARISON:  CT 06/25/2021, 03/19/2021, 07/27/2008 FINDINGS: Lower chest: Lung bases demonstrate no acute consolidation or effusion. Normal cardiac size. Hepatobiliary: Status post  cholecystectomy. No focal hepatic abnormality. Similar extrahepatic biliary dilatation. Pancreas: Unremarkable. No pancreatic ductal dilatation or surrounding inflammatory changes. Spleen: Normal in size without focal abnormality. Adrenals/Urinary Tract: Adrenal glands are normal. Cysts in the right kidney measuring up to 6.5 cm. Punctate stone in the right kidney. No hydronephrosis or ureteral stone. The bladder is unremarkable Stomach/Bowel: The stomach is nonenlarged. Patient is status post ileocecal resection. Redemonstrated fluid-filled neoterminal ileum with mild wall thickening. Mildly dilated fluid-filled small bowel upstream to the area of bowel wall thickening. Mild wall thickening of the proximal transverse colon. Mild diverticular disease left colon Vascular/Lymphatic: Mild aortic atherosclerosis. No aneurysm. No suspicious nodes Reproductive: Uterus and bilateral adnexa are unremarkable. Other: Negative for pelvic effusion or free air Musculoskeletal: Degenerative changes. No acute osseous abnormality. IMPRESSION: 1. Status post ileocecal resection with redemonstrated mild fluid distention and wall thickening of the neo terminal ileum. Some inflammatory changes in the fat surrounding the thickened neoterminal ileum, suspect for acute on chronic Crohn's disease. Mild fluid distension of distal upstream small bowel could reflect low-grade obstructive changes, this is increased compared to the  exam 06/25/2021. Probable mild wall thickening/colitis of the transverse colon as well. 2. Nonobstructing right kidney stones Electronically Signed   By: Donavan Foil M.D.   On: 07/26/2021 15:56   Medications: I have reviewed the patient's current medications. Prior to Admission:  Medications Prior to Admission  Medication Sig Dispense Refill Last Dose   azaTHIOprine (IMURAN) 50 MG tablet Take 50 mg by mouth daily.   07/25/2021 at 0830   Calcium Carbonate-Vitamin D 600-400 MG-UNIT tablet Take 1 tablet by mouth 2  (two) times daily.   07/25/2021 at 0830   Cholecalciferol 50 MCG (2000 UT) CAPS Take 2,000 Units by mouth daily.   07/25/2021 at 0830   cyanocobalamin 1000 MCG tablet Take 1,000 mcg by mouth daily.   07/25/2021 at 0830   ferrous sulfate 325 (65 FE) MG tablet Take 325 mg by mouth daily.   07/25/2021 at 0830   Multiple Vitamin (MULTI-VITAMIN) tablet Take 1 tablet by mouth daily.   07/25/2021 at 0830   omeprazole (PRILOSEC) 20 MG capsule Take 20 mg by mouth daily.   07/25/2021 at 0800   potassium chloride (KLOR-CON) 10 MEQ tablet Take 10 mEq by mouth daily.   07/25/2021 at 0830   acetaminophen (TYLENOL) 650 MG CR tablet Take 1,300 mg by mouth every 8 (eight) hours as needed for pain.   unknown at prn   inFLIXimab (REMICADE) 100 MG injection Inject into the vein.      methylPREDNISolone sodium succinate (SOLU-MEDROL) 40 mg/mL injection Inject into the vein.      oxyCODONE-acetaminophen (PERCOCET) 5-325 MG tablet Take 1 tablet by mouth every 4 (four) hours as needed for severe pain. (Patient not taking: No sig reported) 20 tablet 0 Not Taking   predniSONE (DELTASONE) 10 MG tablet Take 1 tablet (10 mg total) by mouth daily. Day 1-3: take 4 tablets PO daily Day 4-6: take 3 tablets PO daily Day 7-9: take 2 tablets PO daily Day 10-12: take 1 tablet PO daily (Patient not taking: No sig reported) 30 tablet 0 Not Taking   Scheduled:  enoxaparin (LOVENOX) injection  30 mg Subcutaneous Q24H   methylPREDNISolone (SOLU-MEDROL) injection  40 mg Intravenous Daily   pantoprazole  40 mg Oral Daily   potassium chloride  40 mEq Oral Q4H   sodium chloride flush  3 mL Intravenous Q12H   Continuous:  cefTRIAXone (ROCEPHIN)  IV 1 g (07/27/21 1559)   magnesium sulfate bolus IVPB     metronidazole 500 mg (07/28/21 0814)   [id number] **OR** acetaminophen, ondansetron **OR** ondansetron (ZOFRAN) IV, oxyCODONE, traZODone Anti-infectives (From admission, onward)    Start     Dose/Rate Route Frequency Ordered Stop   07/27/21  1600  cefTRIAXone (ROCEPHIN) 1 g in sodium chloride 0.9 % 100 mL IVPB        1 g 200 mL/hr over 30 Minutes Intravenous Every 24 hours 07/26/21 2111     07/27/21 0000  metroNIDAZOLE (FLAGYL) IVPB 500 mg        500 mg 100 mL/hr over 60 Minutes Intravenous Every 8 hours 07/26/21 2111     07/26/21 1645  metroNIDAZOLE (FLAGYL) IVPB 500 mg        500 mg 100 mL/hr over 60 Minutes Intravenous  Once 07/26/21 1642 07/26/21 1829   07/26/21 1430  cefTRIAXone (ROCEPHIN) 1 g in sodium chloride 0.9 % 100 mL IVPB        1 g 200 mL/hr over 30 Minutes Intravenous  Once 07/26/21 1415 07/26/21 1449  Scheduled Meds:  enoxaparin (LOVENOX) injection  30 mg Subcutaneous Q24H   methylPREDNISolone (SOLU-MEDROL) injection  40 mg Intravenous Daily   pantoprazole  40 mg Oral Daily   potassium chloride  40 mEq Oral Q4H   sodium chloride flush  3 mL Intravenous Q12H   Continuous Infusions:  cefTRIAXone (ROCEPHIN)  IV 1 g (07/27/21 1559)   magnesium sulfate bolus IVPB     metronidazole 500 mg (07/28/21 0814)   PRN Meds:.acetaminophen **OR** acetaminophen, ondansetron **OR** ondansetron (ZOFRAN) IV, oxyCODONE, traZODone   Assessment: Active Problems:   COPD (chronic obstructive pulmonary disease) (HCC)   Hypertension   Crohn's colitis ([place])   Chronic kidney disease (CKD), stage III (moderate) ([place])  Bridget Huber is a 70 y.o. female with history of small bowel Crohn's s/p ileocecal resection previously maintained on azathioprine, now has postop recurrence of Crohn's disease with neoterminal ileal stricture who is admitted with small bowel obstruction.  Patient underwent colonoscopy with dilation of the stricture in 02/2021.  Pathology confirmed severe chronic active ileitis.  From outpatient GI notes, it appears that patient is started on Remicade 5 mg/kg every [redacted] weeks along with azathioprine and her last dose was on 07/17/2021 which was her first maintenance dose.   Plan: Crohn's exacerbation Continue  Solu-Medrol 40 mg IV daily Continue empiric antibiotics Advance to full liquid diet today, okay to stop IV fluids, encourage oral hydration.  If patient is tolerating fluids well, switch to prednisone 40 mg daily as outpatient  Patient has an appointment with [facility] surgery on 9/8.  Strongly advised patient to stick to full liquid and very soft diet and bowel regimen to keep her bowels regular and continue taking prednisone 40 mg daily Continue outpatient Remicade, if surgery is not an option, recommend high-dose Remicade 10 mg/kg body weight every 4 weeks Do not recommend repeat colonoscopy at this time, will be of low yield Patient can go home later today if she is tolerating full liquids   LOS: 2 days   Indria Bishara 07/28/2021, 11:52 AM

## 2021-07-29 DIAGNOSIS — K50012 Crohn's disease of small intestine with intestinal obstruction: Secondary | ICD-10-CM | POA: Diagnosis not present

## 2021-07-29 LAB — BASIC METABOLIC PANEL
Anion gap: 3 — ABNORMAL LOW (ref 5–15)
BUN: 16 mg/dL (ref 8–23)
CO2: 23 mmol/L (ref 22–32)
Calcium: 7.5 mg/dL — ABNORMAL LOW (ref 8.9–10.3)
Chloride: 114 mmol/L — ABNORMAL HIGH (ref 98–111)
Creatinine, Ser: 1.55 mg/dL — ABNORMAL HIGH (ref 0.44–1.00)
GFR, Estimated: 35 mL/min — ABNORMAL LOW (ref >60)
Glucose, Bld: 89 mg/dL (ref 70–99)
Potassium: 3.2 mmol/L — ABNORMAL LOW (ref 3.5–5.1)
Sodium: 140 mmol/L (ref 135–145)

## 2021-07-29 LAB — URINE CULTURE: Culture: >=100000 — AB

## 2021-07-29 LAB — CBC
HCT: 30.8 % — ABNORMAL LOW (ref 36.0–46.0)
Hemoglobin: 10.3 g/dL — ABNORMAL LOW (ref 12.0–15.0)
MCH: 30.8 pg (ref 26.0–34.0)
MCHC: 33.4 g/dL (ref 30.0–36.0)
MCV: 92.2 fL (ref 80.0–100.0)
Platelets: 222 10*3/uL (ref 150–400)
RBC: 3.34 MIL/uL — ABNORMAL LOW (ref 3.87–5.11)
RDW: 16 % — ABNORMAL HIGH (ref 11.5–15.5)
WBC: 4.8 10*3/uL (ref 4.0–10.5)
nRBC: 0 % (ref 0.0–0.2)

## 2021-07-29 LAB — MAGNESIUM: Magnesium: 2.8 mg/dL — ABNORMAL HIGH (ref 1.7–2.4)

## 2021-07-29 MED ORDER — POTASSIUM CHLORIDE ER 10 MEQ PO TBCR: 40.0000 meq | EXTENDED_RELEASE_TABLET | Freq: Every day | ORAL | 0 refills | Status: AC

## 2021-07-29 MED ORDER — CIPROFLOXACIN HCL 500 MG PO TABS: 500.0000 mg | ORAL_TABLET | Freq: Two times a day (BID) | ORAL | 0 refills | Status: AC

## 2021-07-29 MED ORDER — PREDNISONE 20 MG PO TABS: 40.0000 mg | ORAL_TABLET | Freq: Every day | ORAL | 0 refills | Status: AC

## 2021-07-29 MED ORDER — METRONIDAZOLE 500 MG PO TABS: 500.0000 mg | ORAL_TABLET | Freq: Three times a day (TID) | ORAL | 0 refills | Status: AC

## 2021-07-29 MED ORDER — POTASSIUM CHLORIDE CRYS ER 20 MEQ PO TBCR
40.0000 meq | EXTENDED_RELEASE_TABLET | Freq: Once | ORAL | Status: AC
  Administered 2021-07-29: 40 meq via ORAL
  Filled 2021-07-29: qty 2

## 2021-07-29 NOTE — Progress Notes (Signed)
Reviewed discharge instructions with pt. Pt verbalized understanding. Iv intact when removed. Pt discharged with all personal belongings. Staff wheeled pt out. Pt transported to home via family vehicle.

## 2021-07-29 NOTE — TOC Progression Note (Signed)
Transition of Care [facility]) - Progression Note    Patient Details  Name: Bridget Huber MRN: [id number] Date of Birth: August 05, 1951  Transition of Care [facility]) CM/SW [place], RN Phone Number: 07/29/2021, 10:28 AM  Clinical Narrative:   Patient lives alone at home, family can assist her if needed, as well as assisting with transportation to appointments and pharmacy for medications.  She states she is able to take medications as directed.   Patient's only concern about returning home is that her potassium is low this morning.  She states she would like to stay until it is at a normal level to present readmission. MD in to speak with patient.   TOC contact information given, TOC to follow for needs.       Expected Discharge Plan and Services           Expected Discharge Date: 07/29/21                                     Social Determinants of Health (SDOH) Interventions    Readmission Risk Interventions Readmission Risk Prevention Plan 07/29/2021  Transportation Screening Complete  PCP or Specialist Appt within 5-7 Days Complete  Home Care Screening Complete  Medication Review (RN CM) Complete  Some recent data might be hidden

## 2021-07-29 NOTE — Evaluation (Signed)
Physical Therapy Evaluation Patient Details Name: Bridget Huber MRN: [id number] DOB: [birth year] Today's Date: 07/29/2021   History of Present Illness  Bridget Huber is a 65yoF who [facility] on [facility]21 with ABD pain. PMH: Crohn's disease, COPD, CKD, HTN. Pt c/o RLQ pian, N/V/D. CT ABD consistent with acute on chronic Crohn's exacerbation.  Clinical Impression  Pt admitted with above diagnosis. Pt currently with functional limitations due to the deficits listed below (see "PT Problem List"). Upon entry, pt in bed, awake and agreeable to participate. The pt is alert, pleasant, interactive, and able to provide info regarding prior level of function, both in tolerance and independence. Pt reports she has been getting up daily since being here, but today felt more unsteady in gait upon mobilizing. Pt denies having any dizziness earlier as reports. Pt performs all mobility with modified independence, only needs help for author assisting with IV pole. Pt does not show any LOB, no DME used for mobility. As patient is at baseline level of independence, all education completed, and time is given to address all questions/concerns. No additional skilled PT services needed at this time, PT signing off. PT recommends daily ambulation ad lib or with nursing staff as needed to prevent deconditioning.      Orthostatic VS for the past 24 hrs (Last 3 readings):  BP- Lying Pulse- Lying BP- Sitting Pulse- Sitting BP- Standing at 0 minutes Pulse- Standing at 0 minutes  07/29/21 1118 103/61 73 118/68 78 120/67 79  *no symptoms with testing       Follow Up Recommendations No PT follow up    Equipment Recommendations  None recommended by PT    Recommendations for Other Services       Precautions / Restrictions Precautions Precautions: None Restrictions Weight Bearing Restrictions: No      Mobility  Bed Mobility Overal bed mobility: Independent                  Transfers Overall transfer  level: Independent                  Ambulation/Gait Ambulation/Gait assistance: Modified independent (Device/Increase time) Gait Distance (Feet): 500 Feet Assistive device: None Gait Pattern/deviations: WFL(Within Functional Limits) Gait velocity: 0.64ms   General Gait Details: ACuratorIV pole  SFinancial traderRankin (Stroke Patients Only)       Balance Overall balance assessment: Independent                                           Pertinent Vitals/Pain Pain Assessment: No/denies pain    Home Living Family/patient expects to be discharged to:: Private residence Living Arrangements: Alone Available Help at Discharge: Family Type of Home: Apartment Home Access: Stairs to enter Entrance Stairs-Rails: None ETechnical brewerof Steps: 3 Home Layout: One level Home Equipment: None      Prior Function Level of Independence: Independent         Comments: retired last year     HJournalist, newspaper       Extremity/Trunk Assessment   Upper Extremity Assessment Upper Extremity Assessment: Overall WFL for tasks assessed    Lower Extremity Assessment Lower Extremity Assessment: Overall WFL for tasks assessed       Communication      Cognition Arousal/Alertness: Awake/alert  Behavior During Therapy: WFL for tasks assessed/performed Overall Cognitive Status: Within Functional Limits for tasks assessed                                        General Comments      Exercises     Assessment/Plan    PT Assessment Patent does not need any further PT services  PT Problem List         PT Treatment Interventions      PT Goals (Current goals can be found in the Care Plan section)  Acute Rehab PT Goals PT Goal Formulation: All assessment and education complete, DC therapy    Frequency     Barriers to discharge        Co-evaluation               AM-PAC  PT "6 Clicks" Mobility  Outcome Measure Help needed turning from your back to your side while in a flat bed without using bedrails?: None Help needed moving from lying on your back to sitting on the side of a flat bed without using bedrails?: None Help needed moving to and from a bed to a chair (including a wheelchair)?: None Help needed standing up from a chair using your arms (e.g., wheelchair or bedside chair)?: None Help needed to walk in hospital room?: A Little Help needed climbing 3-5 steps with a railing? : A Little 6 Click Score: 22    End of Session Equipment Utilized During Treatment: Gait belt Activity Tolerance: Patient tolerated treatment well;No increased pain Patient left: in bed;with call bell/phone within reach Nurse Communication: Mobility status PT Visit Diagnosis: Difficulty in walking, not elsewhere classified (R26.2)    Time: [id number] PT Time Calculation (min) (ACUTE ONLY): 14 min   Charges:   PT Evaluation $PT Eval Low Complexity: 1 Low         11:38 AM, 07/29/21 Etta Grandchild, PT, DPT Physical Therapist - [facility]  [phone] ([place])    [place] C 07/29/2021, 11:35 AM

## 2021-07-29 NOTE — Progress Notes (Signed)
Nutrition Education Note  RD consulted for nutrition education regarding low fiber/ GI sofr diet.    RD provided "Fiber Restricted Nutrition Therapy" handout from the Academy of Nutrition and Dietetics. Reviewed patient's dietary recall and discussed ways for pt to meet nutrition goals over the next several weeks. Explained reasons for pt to follow a low fiber diet over the next few weeks and following her surgery after discharge. Reviewed low fiber foods and high fiber foods.   Teach back method used. Pt verbalizes understanding of information provided.   Expect good compliance.  Body mass index is Body mass index is 27.25 kg/m.Marland Kitchen Pt meets criteria for overweight based on current BMI, in desired range for age.  Current diet order is GI soft,, patient is consuming approximately 100% of meals at this time. Labs and medications reviewed. No further nutrition interventions warranted at this time. RD contact information provided. If additional nutrition issues arise, please re-consult RD.  Ranell Patrick, RD, LDN Clinical Dietitian Pager on [place]

## 2021-07-29 NOTE — Progress Notes (Signed)
Bridget Darby, MD [address]  [place]  [place], [place] [address]  Main: [phone]  Fax: [phone] Pager: [phone]   Subjective: No acute events overnight.  Patient felt woozy this morning when she tried to get up from her bed.  Her potassium is 3.2.  She had 2 loose bowel movements this morning.  She has been on liquid diet only.  She denies any abdominal pain    Objective: Vital signs in last 24 hours: Vitals:   07/28/21 1552 07/28/21 2103 07/29/21 0504 07/29/21 0940  BP: (!) 100/52 107/64 120/64 (!) 121/56  Pulse: 78 83 67 80  Resp: '16 18 18 18  '$ Temp: 98.4 F (36.9 C) 98.6 F (37 C) 98.5 F (36.9 C) 97.6 F (36.4 C)  TempSrc:  Oral Oral Oral  SpO2: 97% 99% 100% 100%  Weight:      Height:       Weight change:   Intake/Output Summary (Last 24 hours) at 07/29/2021 1109 Last data filed at 07/29/2021 0851 Gross per 24 hour  Intake 472.08 ml  Output --  Net 472.08 ml     Exam: Heart:: Regular rate and rhythm, S1S2 present, or without murmur or extra heart sounds Lungs: normal and clear to auscultation Abdomen: soft, mild right lower quadrant tenderness, normal bowel sounds   Lab Results: CBC Latest Ref Rng & Units 07/29/2021 07/28/2021 07/27/2021  WBC 4.0 - 10.5 K/uL 4.8 5.4 4.1  Hemoglobin 12.0 - 15.0 g/dL 10.3(L) 10.3(L) 10.5(L)  Hematocrit 36.0 - 46.0 % 30.8(L) 30.2(L) 30.9(L)  Platelets 150 - 400 K/uL 222 218 216   CMP Latest Ref Rng & Units 07/29/2021 07/28/2021 07/27/2021  Glucose 70 - 99 mg/dL 89 99 105(H)  BUN 8 - 23 mg/dL 16 21 28(H)  Creatinine 0.44 - 1.00 mg/dL 1.55(H) 1.71(H) 1.83(H)  Sodium 135 - 145 mmol/L 140 137 141  Potassium 3.5 - 5.1 mmol/L 3.2(L) 2.8(L) 3.3(L)  Chloride 98 - 111 mmol/L 114(H) 111 113(H)  CO2 22 - 32 mmol/L 23 20(L) 21(L)  Calcium 8.9 - 10.3 mg/dL 7.5(L) 7.5(L) 7.6(L)  Total Protein 6.5 - 8.1 g/dL - - -  Total Bilirubin 0.3 - 1.2 mg/dL - - -  Alkaline Phos 38 - 126 U/L - - -  AST 15 - 41 U/L - - -  ALT 0 -  44 U/L - - -    Micro Results: Recent Results (from the past 240 hour(s))  Urine Culture     Status: Abnormal   Collection Time: 07/26/21  2:14 PM   Specimen: Urine, Random  Result Value Ref Range Status   Specimen Description   Final    URINE, RANDOM Performed at [facility], [address], [place], [place] [address]    Special Requests   Final    NONE Performed at [facility], [address] [place], [place] [address]    Culture >=100,000 COLONIES/mL ESCHERICHIA COLI (A)  Final   Report Status 07/29/2021 FINAL  Final   Organism ID, Bacteria ESCHERICHIA COLI (A)  Final      Susceptibility   Escherichia coli - MIC*    AMPICILLIN <=2 SENSITIVE Sensitive     CEFAZOLIN <=4 SENSITIVE Sensitive     CEFEPIME <=0.12 SENSITIVE Sensitive     CEFTRIAXONE <=0.25 SENSITIVE Sensitive     CIPROFLOXACIN <=0.25 SENSITIVE Sensitive     GENTAMICIN <=1 SENSITIVE Sensitive     IMIPENEM <=0.25 SENSITIVE Sensitive     NITROFURANTOIN <=16 SENSITIVE  Sensitive     TRIMETH/SULFA <=20 SENSITIVE Sensitive     AMPICILLIN/SULBACTAM <=2 SENSITIVE Sensitive     PIP/TAZO <=4 SENSITIVE Sensitive     * >=100,000 COLONIES/mL ESCHERICHIA COLI  Resp Panel by RT-PCR (Flu A&B, Covid) Nasopharyngeal Swab     Status: None   Collection Time: 07/26/21  2:16 PM   Specimen: Nasopharyngeal Swab; Nasopharyngeal(NP) swabs in vial transport medium  Result Value Ref Range Status   SARS Coronavirus 2 by RT PCR NEGATIVE NEGATIVE Final    Comment: (NOTE) SARS-CoV-2 target nucleic acids are NOT DETECTED.  The SARS-CoV-2 RNA is generally detectable in upper respiratory specimens during the acute phase of infection. The lowest concentration of SARS-CoV-2 viral copies this assay can detect is 138 copies/mL. A negative result does not preclude SARS-Cov-2 infection and should not be used as the sole basis for treatment or other patient management decisions. A negative result may occur with  improper  specimen collection/handling, submission of specimen other than nasopharyngeal swab, presence of viral mutation(s) within the areas targeted by this assay, and inadequate number of viral copies(<138 copies/mL). A negative result must be combined with clinical observations, patient history, and epidemiological information. The expected result is Negative.  Fact Sheet for Patients:  EntrepreneurPulse.com.au  Fact Sheet for Healthcare Providers:  IncredibleEmployment.be  This test is no t yet approved or cleared by the [place] FDA and  has been authorized for detection and/or diagnosis of SARS-CoV-2 by FDA under an Emergency Use Authorization (EUA). This EUA will remain  in effect (meaning this test can be used) for the duration of the COVID-19 declaration under Section 564(b)(1) of the Act, 21 U.S.C.section 360bbb-3(b)(1), unless the authorization is terminated  or revoked sooner.       Influenza A by PCR NEGATIVE NEGATIVE Final   Influenza B by PCR NEGATIVE NEGATIVE Final    Comment: (NOTE) The Xpert Xpress SARS-CoV-2/FLU/RSV plus assay is intended as an aid in the diagnosis of influenza from Nasopharyngeal swab specimens and should not be used as a sole basis for treatment. Nasal washings and aspirates are unacceptable for Xpert Xpress SARS-CoV-2/FLU/RSV testing.  Fact Sheet for Patients: EntrepreneurPulse.com.au  Fact Sheet for Healthcare Providers: IncredibleEmployment.be  This test is not yet approved or cleared by the [place] FDA and has been authorized for detection and/or diagnosis of SARS-CoV-2 by FDA under an Emergency Use Authorization (EUA). This EUA will remain in effect (meaning this test can be used) for the duration of the COVID-19 declaration under Section 564(b)(1) of the Act, 21 U.S.C. section 360bbb-3(b)(1), unless the authorization is terminated or revoked.  Performed at  [facility], [address] [place], [place] [address]    Studies/Results: No results found. Medications: I have reviewed the patient's current medications. Prior to Admission:  Medications Prior to Admission  Medication Sig Dispense Refill Last Dose   azaTHIOprine (IMURAN) 50 MG tablet Take 50 mg by mouth daily.   07/25/2021 at 0830   Calcium Carbonate-Vitamin D 600-400 MG-UNIT tablet Take 1 tablet by mouth 2 (two) times daily.   07/25/2021 at 0830   Cholecalciferol 50 MCG (2000 UT) CAPS Take 2,000 Units by mouth daily.   07/25/2021 at 0830   cyanocobalamin 1000 MCG tablet Take 1,000 mcg by mouth daily.   07/25/2021 at 0830   ferrous sulfate 325 (65 FE) MG tablet Take 325 mg by mouth daily.   07/25/2021 at 0830   Multiple Vitamin (MULTI-VITAMIN) tablet Take 1 tablet by mouth daily.   07/25/2021  at 0830   omeprazole (PRILOSEC) 20 MG capsule Take 20 mg by mouth daily.   07/25/2021 at 0800   [DISCONTINUED] potassium chloride (KLOR-CON) 10 MEQ tablet Take 10 mEq by mouth daily.   07/25/2021 at 0830   acetaminophen (TYLENOL) 650 MG CR tablet Take 1,300 mg by mouth every 8 (eight) hours as needed for pain.   unknown at prn   inFLIXimab (REMICADE) 100 MG injection Inject into the vein.      methylPREDNISolone sodium succinate (SOLU-MEDROL) 40 mg/mL injection Inject into the vein.      oxyCODONE-acetaminophen (PERCOCET) 5-325 MG tablet Take 1 tablet by mouth every 4 (four) hours as needed for severe pain. (Patient not taking: No sig reported) 20 tablet 0 Not Taking   [DISCONTINUED] predniSONE (DELTASONE) 10 MG tablet Take 1 tablet (10 mg total) by mouth daily. Day 1-3: take 4 tablets PO daily Day 4-6: take 3 tablets PO daily Day 7-9: take 2 tablets PO daily Day 10-12: take 1 tablet PO daily (Patient not taking: No sig reported) 30 tablet 0 Not Taking   Scheduled:  enoxaparin (LOVENOX) injection  30 mg Subcutaneous Q24H   methylPREDNISolone (SOLU-MEDROL) injection  40 mg Intravenous Daily    pantoprazole  40 mg Oral Daily   sodium chloride flush  3 mL Intravenous Q12H   Continuous:  cefTRIAXone (ROCEPHIN)  IV 1 g (07/28/21 1724)   metronidazole 500 mg (07/29/21 0856)   [id number] **OR** acetaminophen, ondansetron **OR** ondansetron (ZOFRAN) IV, oxyCODONE, traZODone Anti-infectives (From admission, onward)    Start     Dose/Rate Route Frequency Ordered Stop   07/27/21 1600  cefTRIAXone (ROCEPHIN) 1 g in sodium chloride 0.9 % 100 mL IVPB        1 g 200 mL/hr over 30 Minutes Intravenous Every 24 hours 07/26/21 2111     07/27/21 0000  metroNIDAZOLE (FLAGYL) IVPB 500 mg        500 mg 100 mL/hr over 60 Minutes Intravenous Every 8 hours 07/26/21 2111     07/26/21 1645  metroNIDAZOLE (FLAGYL) IVPB 500 mg        500 mg 100 mL/hr over 60 Minutes Intravenous  Once 07/26/21 1642 07/26/21 1829   07/26/21 1430  cefTRIAXone (ROCEPHIN) 1 g in sodium chloride 0.9 % 100 mL IVPB        1 g 200 mL/hr over 30 Minutes Intravenous  Once 07/26/21 1415 07/26/21 1449      Scheduled Meds:  enoxaparin (LOVENOX) injection  30 mg Subcutaneous Q24H   methylPREDNISolone (SOLU-MEDROL) injection  40 mg Intravenous Daily   pantoprazole  40 mg Oral Daily   sodium chloride flush  3 mL Intravenous Q12H   Continuous Infusions:  cefTRIAXone (ROCEPHIN)  IV 1 g (07/28/21 1724)   metronidazole 500 mg (07/29/21 0856)   PRN Meds:.acetaminophen **OR** acetaminophen, ondansetron **OR** ondansetron (ZOFRAN) IV, oxyCODONE, traZODone   Assessment: Active Problems:   COPD (chronic obstructive pulmonary disease) (HCC)   Hypertension   Crohn's colitis ([place])   Chronic kidney disease (CKD), stage III (moderate) (HCC)  Bridget Huber is a 70 y.o. female with history of small bowel Crohn's s/p ileocecal resection previously maintained on azathioprine, now has postop recurrence of Crohn's disease with neoterminal ileal stricture who is admitted with small bowel obstruction.  Patient underwent colonoscopy  with dilation of the stricture in 02/2021.  Pathology confirmed severe chronic active ileitis.  From outpatient GI notes, it appears that patient is started on Remicade 5 mg/kg every [redacted] weeks along with  azathioprine and her last dose was on 07/17/2021 which was her first maintenance dose.   Plan: Crohn's exacerbation Disposed Solu-Medrol 40 mg IV daily x3 days Recommend Cipro and Flagyl 500 mg twice daily for 2 weeks Discussed about soft diet only Continue prednisone 40 mg daily as outpatient until seen by surgery Patient has an appointment with [facility] on 9/8.  Strongly advised patient to stick to full liquid and very soft diet and bowel regimen to keep her bowels regular and continue taking prednisone 40 mg daily Continue outpatient Remicade, if surgery is not an option, recommend high-dose Remicade 10 mg/kg body weight every 4 weeks Do not recommend repeat colonoscopy at this time, will be of low yield Patient can be discharged home today Patient to follow-up with [facility] GI, [facility] will sign off at this time, please call us back with questions or concerns   LOS: 3 days   Edra Riccardi 07/29/2021, 11:09 AM

## 2021-07-29 NOTE — Care Management Important Message (Signed)
Important Message  Patient Details  Name: Bridget Huber MRN: [id number] Date of Birth: 06-21-1951   Medicare Important Message Given:  Yes     Juliann Pulse A Lindsi Bayliss 07/29/2021, 12:12 PM

## 2021-07-29 NOTE — Discharge Summary (Signed)
Physician Discharge Summary   Bridget Huber  female DOB: [birth year]  [id number]  PCP: Kirk Ruths, MD  Admit date: 07/26/2021 Discharge date: 07/29/2021  Admitted From: home Disposition:  home Home Health: No need, per PT eval CODE STATUS: DNR   Discharge Instructions     Discharge instructions   Complete by: As directed    You were treated with IV steroid for Crohn's flare.  GI Dr. Marius Ditch cleared you for discharge home with prednisone 40 mg daily until you follow up with your outpatient GI Dr. Jacqulyn Liner.  Please keep very soft diet as instructed by Dr. Marius Ditch and dietician.    Dr. Marius Ditch also wants you to continue oral antibiotics Cipro and Flagyl for 2 weeks as directed.  Keep your appointment with [place] [facility] on 9/8.  Your potassium has been low, so I have increased your potassium supplement to 40 mEq daily for the next 7 days, then go back to your usual 10 mEq daily.   Dr. Enzo Bi [facility] Course:  For full details, please see H&P, progress notes, consult notes and ancillary notes.  Briefly,  Bridget Huber is a 70 y.o. female with hx of Crohn's disease, COPD, CKD, hypertension, who presented with abdominal pain.   Patient was seen in the ED last month, she was treated with IV fluids, pain medicines, and discharged with p.o. prednisone.  Pt reported she received some relief from the prednisone but that it did not last.  Over the past days to weeks she has had increasingly worse abdominal pain located primarily in her right lower quadrant.  In addition she has had nausea, vomiting, diarrhea.   She denies any bright red blood per rectum, though does note that her stool has looked like coffee grounds.     She reports she has been on azathioprine for many years without any flareups, her last 1 was in the early 2000's when she had a bowel resection.   #Acute on chronic Crohn's disease --presented with abdominal pain and diarrhea.   --CT findings  consistent with Crohn's flare, patient has failed outpatient management with p.o. prednisone despite taking her maintenance azathioprine. --started on IV solumedrol, ceftriaxone, flagyl, and IVF --Pt received IV solumedrol while inpatient and was discharged on prednisone 40 mg daily until followup with outpatient GI. --Pt was discharged on Cipro and Flagyl for 2 weeks, per GI rec. --Pt was slowly advanced from Full liquid diet to soft diet.    #AKI --Cr 2.05 on presentation.  Likely prerenal in setting of frequent emesis and diarrhea, status post 2 L LR bolus and MIVF.  Cr improved to 1.55 prior to discharge.   # possible UTI from E coli --treated with ceftriaxone.   GERD --cont PPI  Hypokalemia --likely exacerbated by diarrheal loss.  Prescribed potassium 40 mEq daily for 7 days at home and then resume home 10 mEq daily.  Hypomag --repleted with IV mag   Discharge Diagnoses:  Active Problems:   COPD (chronic obstructive pulmonary disease) (HCC)   Hypertension   Crohn's colitis ([place])   Chronic kidney disease (CKD), stage III (moderate) ([place])   30 Day Unplanned Readmission Risk Score    Flowsheet Row ED to Hosp-Admission (Current) from 07/26/2021 in [place] (1A)  30 Day Unplanned Readmission Risk Score (%) 21.19 Filed at 07/29/2021 0801       This score is the patient's risk of an unplanned readmission  within 30 days of being discharged (0 -100%). The score is based on dignosis, age, lab data, medications, orders, and past utilization.   Low:  0-14.9   Medium: 15-21.9   High: 22-29.9   Extreme: 30 and above         Discharge Instructions:  Allergies as of 07/29/2021       Reactions   Aspirin         Medication List     STOP taking these medications    methylPREDNISolone sodium succinate 40 mg/mL injection Commonly known as: SOLU-MEDROL   oxyCODONE-acetaminophen 5-325 MG tablet Commonly known as: Percocet       TAKE  these medications    acetaminophen 650 MG CR tablet Commonly known as: TYLENOL Take 1,300 mg by mouth every 8 (eight) hours as needed for pain.   azaTHIOprine 50 MG tablet Commonly known as: IMURAN Take 50 mg by mouth daily.   Calcium Carbonate-Vitamin D 600-400 MG-UNIT tablet Take 1 tablet by mouth 2 (two) times daily.   Cholecalciferol 50 MCG (2000 UT) Caps Take 2,000 Units by mouth daily.   ciprofloxacin 500 MG tablet Commonly known as: Cipro Take 1 tablet (500 mg total) by mouth 2 (two) times daily for 14 days.   cyanocobalamin 1000 MCG tablet Take 1,000 mcg by mouth daily.   ferrous sulfate 325 (65 FE) MG tablet Take 325 mg by mouth daily.   inFLIXimab 100 MG injection Commonly known as: REMICADE Inject into the vein.   metroNIDAZOLE 500 MG tablet Commonly known as: Flagyl Take 1 tablet (500 mg total) by mouth 3 (three) times daily for 14 days.   Multi-Vitamin tablet Take 1 tablet by mouth daily.   omeprazole 20 MG capsule Commonly known as: PRILOSEC Take 20 mg by mouth daily.   potassium chloride 10 MEQ tablet Commonly known as: KLOR-CON Take 4 tablets (40 mEq total) by mouth daily for 7 days. Then go back to your usual 1 tab a day. What changed:  how much to take additional instructions   predniSONE 20 MG tablet Commonly known as: DELTASONE Take 2 tablets (40 mg total) by mouth daily for 14 days. Or until you follow up with your outpatient GI Dr. Jacqulyn Liner. What changed:  medication strength how much to take additional instructions         Follow-up Information     Kirk Ruths, MD Follow up in 1 week(s).   Specialty: Internal Medicine Contact information: [place] [address] [phone]         Ok Edwards, NP Follow up in 1 week(s).   Specialty: Gastroenterology Why: or at your already scheduled appointment on 07/30/21. Contact information: [place] [address] [phone]                 Allergies  Allergen Reactions   Aspirin      The results of significant diagnostics from this hospitalization (including imaging, microbiology, ancillary and laboratory) are listed below for reference.   Consultations:   Procedures/Studies: CT ABDOMEN PELVIS WO CONTRAST  Result Date: 07/26/2021 CLINICAL DATA:  Abdomen pain with nausea vomiting EXAM: CT ABDOMEN AND PELVIS WITHOUT CONTRAST TECHNIQUE: Multidetector CT imaging of the abdomen and pelvis was performed following the standard protocol without IV contrast. COMPARISON:  CT 06/25/2021, 03/19/2021, 07/27/2008 FINDINGS: Lower chest: Lung bases demonstrate no acute consolidation or effusion. Normal cardiac size. Hepatobiliary: Status post cholecystectomy. No  focal hepatic abnormality. Similar extrahepatic biliary dilatation. Pancreas: Unremarkable. No pancreatic ductal dilatation or surrounding inflammatory changes. Spleen: Normal in size without focal abnormality. Adrenals/Urinary Tract: Adrenal glands are normal. Cysts in the right kidney measuring up to 6.5 cm. Punctate stone in the right kidney. No hydronephrosis or ureteral stone. The bladder is unremarkable Stomach/Bowel: The stomach is nonenlarged. Patient is status post ileocecal resection. Redemonstrated fluid-filled neoterminal ileum with mild wall thickening. Mildly dilated fluid-filled small bowel upstream to the area of bowel wall thickening. Mild wall thickening of the proximal transverse colon. Mild diverticular disease left colon Vascular/Lymphatic: Mild aortic atherosclerosis. No aneurysm. No suspicious nodes Reproductive: Uterus and bilateral adnexa are unremarkable. Other: Negative for pelvic effusion or free air Musculoskeletal: Degenerative changes. No acute osseous abnormality. IMPRESSION: 1. Status post ileocecal resection with redemonstrated mild fluid distention and wall thickening of  the neo terminal ileum. Some inflammatory changes in the fat surrounding the thickened neoterminal ileum, suspect for acute on chronic Crohn's disease. Mild fluid distension of distal upstream small bowel could reflect low-grade obstructive changes, this is increased compared to the exam 06/25/2021. Probable mild wall thickening/colitis of the transverse colon as well. 2. Nonobstructing right kidney stones Electronically Signed   By: Donavan Foil M.D.   On: 07/26/2021 15:56      Labs: BNP (last 3 results) No results for input(s): BNP in the last 8760 hours. Basic Metabolic Panel: Recent Labs  Lab 07/26/21 1106 07/27/21 0427 07/28/21 0454 07/29/21 0613  NA 139 141 137 140  K 3.6 3.3* 2.8* 3.2*  CL 113* 113* 111 114*  CO2 17* 21* 20* 23  GLUCOSE 113* 105* 99 89  BUN QUANTITY NOT SUFFICIENT, UNABLE TO PERFORM TEST 28* 21 16  CREATININE 2.34* 1.83* 1.71* 1.55*  CALCIUM 8.7* 7.6* 7.5* 7.5*  MG  --   --  1.1* 2.8*   Liver Function Tests: Recent Labs  Lab 07/26/21 1106  AST 16  ALT 14  ALKPHOS 105  BILITOT 1.1  PROT 6.3*  ALBUMIN 3.0*   Recent Labs  Lab 07/26/21 1106  LIPASE 24   No results for input(s): AMMONIA in the last 168 hours. CBC: Recent Labs  Lab 07/26/21 1106 07/27/21 0427 07/28/21 0454 07/29/21 0613  WBC 5.6 4.1 5.4 4.8  HGB 13.4 10.5* 10.3* 10.3*  HCT 38.7 30.9* 30.2* 30.8*  MCV 89.8 91.2 91.0 92.2  PLT 147* 216 218 222   Cardiac Enzymes: No results for input(s): CKTOTAL, CKMB, CKMBINDEX, TROPONINI in the last 168 hours. BNP: Invalid input(s): POCBNP CBG: No results for input(s): GLUCAP in the last 168 hours. D-Dimer No results for input(s): DDIMER in the last 72 hours. Hgb A1c No results for input(s): HGBA1C in the last 72 hours. Lipid Profile No results for input(s): CHOL, HDL, LDLCALC, TRIG, CHOLHDL, LDLDIRECT in the last 72 hours. Thyroid function studies No results for input(s): TSH, T4TOTAL, T3FREE, THYROIDAB in the last 72  hours.  Invalid input(s): FREET3 Anemia work up No results for input(s): VITAMINB12, FOLATE, FERRITIN, TIBC, IRON, RETICCTPCT in the last 72 hours. Urinalysis    Component Value Date/Time   COLORURINE YELLOW 07/26/2021 1023   APPEARANCEUR HAZY (A) 07/26/2021 1023   LABSPEC 1.020 07/26/2021 1023   PHURINE 5.5 07/26/2021 1023   GLUCOSEU NEGATIVE 07/26/2021 1023   HGBUR TRACE (A) 07/26/2021 1023   BILIRUBINUR NEGATIVE 07/26/2021 1023   KETONESUR TRACE (A) 07/26/2021 1023   PROTEINUR 100 (A) 07/26/2021 1023   NITRITE NEGATIVE 07/26/2021 1023   LEUKOCYTESUR SMALL (A)  07/26/2021 1023   Sepsis Labs Invalid input(s): PROCALCITONIN,  WBC,  LACTICIDVEN Microbiology Recent Results (from the past 240 hour(s))  Urine Culture     Status: Abnormal   Collection Time: 07/26/21  2:14 PM   Specimen: Urine, Random  Result Value Ref Range Status   Specimen Description   Final    URINE, RANDOM Performed at [facility], [address] [place], [place] [address]    Special Requests   Final    NONE Performed at [facility], [place], [place] [address]    Culture >=100,000 COLONIES/mL ESCHERICHIA COLI (A)  Final   Report Status 07/29/2021 FINAL  Final   Organism ID, Bacteria ESCHERICHIA COLI (A)  Final      Susceptibility   Escherichia coli - MIC*    AMPICILLIN <=2 SENSITIVE Sensitive     CEFAZOLIN <=4 SENSITIVE Sensitive     CEFEPIME <=0.12 SENSITIVE Sensitive     CEFTRIAXONE <=0.25 SENSITIVE Sensitive     CIPROFLOXACIN <=0.25 SENSITIVE Sensitive     GENTAMICIN <=1 SENSITIVE Sensitive     IMIPENEM <=0.25 SENSITIVE Sensitive     NITROFURANTOIN <=16 SENSITIVE Sensitive     TRIMETH/SULFA <=20 SENSITIVE Sensitive     AMPICILLIN/SULBACTAM <=2 SENSITIVE Sensitive     PIP/TAZO <=4 SENSITIVE Sensitive     * >=100,000 COLONIES/mL ESCHERICHIA COLI  Resp Panel by RT-PCR (Flu A&B, Covid) Nasopharyngeal Swab     Status: None   Collection Time: 07/26/21  2:16 PM    Specimen: Nasopharyngeal Swab; Nasopharyngeal(NP) swabs in vial transport medium  Result Value Ref Range Status   SARS Coronavirus 2 by RT PCR NEGATIVE NEGATIVE Final    Comment: (NOTE) SARS-CoV-2 target nucleic acids are NOT DETECTED.  The SARS-CoV-2 RNA is generally detectable in upper respiratory specimens during the acute phase of infection. The lowest concentration of SARS-CoV-2 viral copies this assay can detect is 138 copies/mL. A negative result does not preclude SARS-Cov-2 infection and should not be used as the sole basis for treatment or other patient management decisions. A negative result may occur with  improper specimen collection/handling, submission of specimen other than nasopharyngeal swab, presence of viral mutation(s) within the areas targeted by this assay, and inadequate number of viral copies(<138 copies/mL). A negative result must be combined with clinical observations, patient history, and epidemiological information. The expected result is Negative.  Fact Sheet for Patients:  EntrepreneurPulse.com.au  Fact Sheet for Healthcare Providers:  IncredibleEmployment.be  This test is no t yet approved or cleared by the [place] FDA and  has been authorized for detection and/or diagnosis of SARS-CoV-2 by FDA under an Emergency Use Authorization (EUA). This EUA will remain  in effect (meaning this test can be used) for the duration of the COVID-19 declaration under Section 564(b)(1) of the Act, 21 U.S.C.section 360bbb-3(b)(1), unless the authorization is terminated  or revoked sooner.       Influenza A by PCR NEGATIVE NEGATIVE Final   Influenza B by PCR NEGATIVE NEGATIVE Final    Comment: (NOTE) The Xpert Xpress SARS-CoV-2/FLU/RSV plus assay is intended as an aid in the diagnosis of influenza from Nasopharyngeal swab specimens and should not be used as a sole basis for treatment. Nasal washings and aspirates are  unacceptable for Xpert Xpress SARS-CoV-2/FLU/RSV testing.  Fact Sheet for Patients: EntrepreneurPulse.com.au  Fact Sheet for Healthcare Providers: IncredibleEmployment.be  This test is not yet approved or cleared by the [place] FDA and has been authorized for detection and/or diagnosis  of SARS-CoV-2 by FDA under an Emergency Use Authorization (EUA). This EUA will remain in effect (meaning this test can be used) for the duration of the COVID-19 declaration under Section 564(b)(1) of the Act, 21 U.S.C. section 360bbb-3(b)(1), unless the authorization is terminated or revoked.  Performed at [facility], [address] [place], [place] [address]      Total time spend on discharging this patient, including the last patient exam, discussing the hospital stay, instructions for ongoing care as it relates to all pertinent caregivers, as well as preparing the medical discharge records, prescriptions, and/or referrals as applicable, is 60 minutes.    Enzo Bi, MD  [facility] 07/29/2021, 11:51 AM

## 2021-07-29 NOTE — Plan of Care (Addendum)
No acute events during the night. NAD noted VSS. Tolerating Full liquid diet well. No bowel movements tonight.  Problem: Education: Goal: Knowledge of General Education information will improve Description: Including pain rating scale, medication(s)/side effects and non-pharmacologic comfort measures Outcome: Progressing   Problem: Health Behavior/Discharge Planning: Goal: Ability to manage health-related needs will improve Outcome: Progressing   Problem: Clinical Measurements: Goal: Ability to maintain clinical measurements within normal limits will improve Outcome: Progressing Goal: Will remain free from infection Outcome: Progressing Goal: Diagnostic test results will improve Outcome: Progressing Goal: Respiratory complications will improve Outcome: Progressing Goal: Cardiovascular complication will be avoided Outcome: Progressing   Problem: Activity: Goal: Risk for activity intolerance will decrease Outcome: Progressing   Problem: Nutrition: Goal: Adequate nutrition will be maintained Outcome: Progressing   Problem: Coping: Goal: Level of anxiety will decrease Outcome: Progressing   Problem: Elimination: Goal: Will not experience complications related to bowel motility Outcome: Progressing Goal: Will not experience complications related to urinary retention Outcome: Progressing   Problem: Pain Managment: Goal: General experience of comfort will improve Outcome: Progressing   Problem: Safety: Goal: Ability to remain free from injury will improve Outcome: Progressing   Problem: Skin Integrity: Goal: Risk for impaired skin integrity will decrease Outcome: Progressing

## 2021-07-30 DIAGNOSIS — K5000 Crohn's disease of small intestine without complications: Secondary | ICD-10-CM | POA: Diagnosis not present

## 2021-08-01 DIAGNOSIS — K50819 Crohn's disease of both small and large intestine with unspecified complications: Secondary | ICD-10-CM | POA: Diagnosis not present

## 2021-08-14 DIAGNOSIS — N1831 Chronic kidney disease, stage 3a: Secondary | ICD-10-CM | POA: Diagnosis not present

## 2021-08-14 DIAGNOSIS — E876 Hypokalemia: Secondary | ICD-10-CM | POA: Diagnosis not present

## 2021-08-17 DIAGNOSIS — K660 Peritoneal adhesions (postprocedural) (postinfection): Secondary | ICD-10-CM | POA: Diagnosis not present

## 2021-08-17 DIAGNOSIS — N183 Chronic kidney disease, stage 3 unspecified: Secondary | ICD-10-CM | POA: Diagnosis not present

## 2021-08-17 DIAGNOSIS — J449 Chronic obstructive pulmonary disease, unspecified: Secondary | ICD-10-CM | POA: Diagnosis not present

## 2021-08-17 DIAGNOSIS — Z7901 Long term (current) use of anticoagulants: Secondary | ICD-10-CM | POA: Diagnosis not present

## 2021-08-17 DIAGNOSIS — K429 Umbilical hernia without obstruction or gangrene: Secondary | ICD-10-CM | POA: Diagnosis not present

## 2021-08-17 DIAGNOSIS — Z87891 Personal history of nicotine dependence: Secondary | ICD-10-CM | POA: Diagnosis not present

## 2021-08-17 DIAGNOSIS — Z20822 Contact with and (suspected) exposure to covid-19: Secondary | ICD-10-CM | POA: Diagnosis not present

## 2021-08-17 DIAGNOSIS — K56609 Unspecified intestinal obstruction, unspecified as to partial versus complete obstruction: Secondary | ICD-10-CM | POA: Diagnosis not present

## 2021-08-17 DIAGNOSIS — Z5181 Encounter for therapeutic drug level monitoring: Secondary | ICD-10-CM | POA: Diagnosis not present

## 2021-08-17 DIAGNOSIS — L02211 Cutaneous abscess of abdominal wall: Secondary | ICD-10-CM | POA: Diagnosis not present

## 2021-08-17 DIAGNOSIS — I2699 Other pulmonary embolism without acute cor pulmonale: Secondary | ICD-10-CM | POA: Diagnosis not present

## 2021-08-17 DIAGNOSIS — E43 Unspecified severe protein-calorie malnutrition: Secondary | ICD-10-CM | POA: Diagnosis not present

## 2021-08-17 DIAGNOSIS — E860 Dehydration: Secondary | ICD-10-CM | POA: Diagnosis not present

## 2021-08-17 DIAGNOSIS — K565 Intestinal adhesions [bands], unspecified as to partial versus complete obstruction: Secondary | ICD-10-CM | POA: Diagnosis not present

## 2021-08-17 DIAGNOSIS — Z886 Allergy status to analgesic agent status: Secondary | ICD-10-CM | POA: Diagnosis not present

## 2021-08-17 DIAGNOSIS — I82432 Acute embolism and thrombosis of left popliteal vein: Secondary | ICD-10-CM | POA: Diagnosis not present

## 2021-08-17 DIAGNOSIS — J9811 Atelectasis: Secondary | ICD-10-CM | POA: Diagnosis not present

## 2021-08-17 DIAGNOSIS — Z9049 Acquired absence of other specified parts of digestive tract: Secondary | ICD-10-CM | POA: Diagnosis not present

## 2021-08-17 DIAGNOSIS — N179 Acute kidney failure, unspecified: Secondary | ICD-10-CM | POA: Diagnosis not present

## 2021-08-17 DIAGNOSIS — K50012 Crohn's disease of small intestine with intestinal obstruction: Secondary | ICD-10-CM | POA: Diagnosis not present

## 2021-08-17 DIAGNOSIS — J90 Pleural effusion, not elsewhere classified: Secondary | ICD-10-CM | POA: Diagnosis not present

## 2021-08-17 DIAGNOSIS — Z86718 Personal history of other venous thrombosis and embolism: Secondary | ICD-10-CM | POA: Diagnosis not present

## 2021-08-17 DIAGNOSIS — K5080 Crohn's disease of both small and large intestine without complications: Secondary | ICD-10-CM | POA: Diagnosis not present

## 2021-08-17 DIAGNOSIS — R918 Other nonspecific abnormal finding of lung field: Secondary | ICD-10-CM | POA: Diagnosis not present

## 2021-08-17 DIAGNOSIS — K651 Peritoneal abscess: Secondary | ICD-10-CM | POA: Diagnosis not present

## 2021-08-17 DIAGNOSIS — I517 Cardiomegaly: Secondary | ICD-10-CM | POA: Diagnosis not present

## 2021-08-17 DIAGNOSIS — I129 Hypertensive chronic kidney disease with stage 1 through stage 4 chronic kidney disease, or unspecified chronic kidney disease: Secondary | ICD-10-CM | POA: Diagnosis not present

## 2021-08-17 DIAGNOSIS — K50813 Crohn's disease of both small and large intestine with fistula: Secondary | ICD-10-CM | POA: Diagnosis not present

## 2021-08-17 DIAGNOSIS — G8918 Other acute postprocedural pain: Secondary | ICD-10-CM | POA: Diagnosis not present

## 2021-08-17 DIAGNOSIS — R0902 Hypoxemia: Secondary | ICD-10-CM | POA: Diagnosis not present

## 2021-08-17 DIAGNOSIS — Z452 Encounter for adjustment and management of vascular access device: Secondary | ICD-10-CM | POA: Diagnosis not present

## 2021-08-17 DIAGNOSIS — Z6826 Body mass index (BMI) 26.0-26.9, adult: Secondary | ICD-10-CM | POA: Diagnosis not present

## 2021-08-17 DIAGNOSIS — I2693 Single subsegmental pulmonary embolism without acute cor pulmonale: Secondary | ICD-10-CM | POA: Diagnosis not present

## 2021-08-17 DIAGNOSIS — K50013 Crohn's disease of small intestine with fistula: Secondary | ICD-10-CM | POA: Diagnosis not present

## 2021-08-17 DIAGNOSIS — I2694 Multiple subsegmental pulmonary emboli without acute cor pulmonale: Secondary | ICD-10-CM | POA: Diagnosis not present

## 2021-08-17 DIAGNOSIS — K50018 Crohn's disease of small intestine with other complication: Secondary | ICD-10-CM | POA: Diagnosis not present

## 2021-08-17 DIAGNOSIS — K668 Other specified disorders of peritoneum: Secondary | ICD-10-CM | POA: Diagnosis not present

## 2021-08-17 DIAGNOSIS — R188 Other ascites: Secondary | ICD-10-CM | POA: Diagnosis not present

## 2021-08-17 DIAGNOSIS — K50819 Crohn's disease of both small and large intestine with unspecified complications: Secondary | ICD-10-CM | POA: Diagnosis not present

## 2021-08-20 DIAGNOSIS — K565 Intestinal adhesions [bands], unspecified as to partial versus complete obstruction: Secondary | ICD-10-CM | POA: Diagnosis not present

## 2021-08-20 DIAGNOSIS — K50013 Crohn's disease of small intestine with fistula: Secondary | ICD-10-CM | POA: Diagnosis not present

## 2021-08-20 DIAGNOSIS — K50819 Crohn's disease of both small and large intestine with unspecified complications: Secondary | ICD-10-CM | POA: Diagnosis not present

## 2021-08-20 DIAGNOSIS — G8918 Other acute postprocedural pain: Secondary | ICD-10-CM | POA: Diagnosis not present

## 2021-08-20 DIAGNOSIS — K50012 Crohn's disease of small intestine with intestinal obstruction: Secondary | ICD-10-CM | POA: Diagnosis not present

## 2021-08-20 DIAGNOSIS — K429 Umbilical hernia without obstruction or gangrene: Secondary | ICD-10-CM | POA: Diagnosis not present

## 2021-08-20 HISTORY — PX: OTHER SURGICAL HISTORY: SHX169

## 2021-08-21 DIAGNOSIS — G8918 Other acute postprocedural pain: Secondary | ICD-10-CM | POA: Diagnosis not present

## 2021-08-21 DIAGNOSIS — K50819 Crohn's disease of both small and large intestine with unspecified complications: Secondary | ICD-10-CM | POA: Diagnosis not present

## 2021-08-22 DIAGNOSIS — K50012 Crohn's disease of small intestine with intestinal obstruction: Secondary | ICD-10-CM | POA: Diagnosis not present

## 2021-08-22 DIAGNOSIS — J9811 Atelectasis: Secondary | ICD-10-CM | POA: Diagnosis not present

## 2021-08-22 DIAGNOSIS — K56609 Unspecified intestinal obstruction, unspecified as to partial versus complete obstruction: Secondary | ICD-10-CM | POA: Diagnosis not present

## 2021-08-22 DIAGNOSIS — K50819 Crohn's disease of both small and large intestine with unspecified complications: Secondary | ICD-10-CM | POA: Diagnosis not present

## 2021-08-22 DIAGNOSIS — G8918 Other acute postprocedural pain: Secondary | ICD-10-CM | POA: Diagnosis not present

## 2021-08-22 DIAGNOSIS — J90 Pleural effusion, not elsewhere classified: Secondary | ICD-10-CM | POA: Diagnosis not present

## 2021-08-22 DIAGNOSIS — I517 Cardiomegaly: Secondary | ICD-10-CM | POA: Diagnosis not present

## 2021-08-22 DIAGNOSIS — R918 Other nonspecific abnormal finding of lung field: Secondary | ICD-10-CM | POA: Diagnosis not present

## 2021-08-22 DIAGNOSIS — Z9049 Acquired absence of other specified parts of digestive tract: Secondary | ICD-10-CM | POA: Diagnosis not present

## 2021-08-23 DIAGNOSIS — K5080 Crohn's disease of both small and large intestine without complications: Secondary | ICD-10-CM | POA: Diagnosis not present

## 2021-08-23 DIAGNOSIS — K50819 Crohn's disease of both small and large intestine with unspecified complications: Secondary | ICD-10-CM | POA: Diagnosis not present

## 2021-08-24 DIAGNOSIS — Z7901 Long term (current) use of anticoagulants: Secondary | ICD-10-CM | POA: Diagnosis not present

## 2021-08-24 DIAGNOSIS — G8918 Other acute postprocedural pain: Secondary | ICD-10-CM | POA: Diagnosis not present

## 2021-08-24 DIAGNOSIS — I2694 Multiple subsegmental pulmonary emboli without acute cor pulmonale: Secondary | ICD-10-CM | POA: Diagnosis not present

## 2021-08-24 DIAGNOSIS — Z5181 Encounter for therapeutic drug level monitoring: Secondary | ICD-10-CM | POA: Diagnosis not present

## 2021-08-25 DIAGNOSIS — G8918 Other acute postprocedural pain: Secondary | ICD-10-CM | POA: Diagnosis not present

## 2021-08-26 DIAGNOSIS — K50819 Crohn's disease of both small and large intestine with unspecified complications: Secondary | ICD-10-CM | POA: Diagnosis not present

## 2021-08-26 DIAGNOSIS — G8918 Other acute postprocedural pain: Secondary | ICD-10-CM | POA: Diagnosis not present

## 2021-09-10 DIAGNOSIS — K50018 Crohn's disease of small intestine with other complication: Secondary | ICD-10-CM | POA: Diagnosis not present

## 2021-09-10 DIAGNOSIS — L02211 Cutaneous abscess of abdominal wall: Secondary | ICD-10-CM | POA: Diagnosis not present

## 2021-09-17 DIAGNOSIS — K50018 Crohn's disease of small intestine with other complication: Secondary | ICD-10-CM | POA: Diagnosis not present

## 2021-09-17 DIAGNOSIS — L02211 Cutaneous abscess of abdominal wall: Secondary | ICD-10-CM | POA: Diagnosis not present

## 2021-09-18 DIAGNOSIS — L02211 Cutaneous abscess of abdominal wall: Secondary | ICD-10-CM | POA: Diagnosis not present

## 2021-09-18 DIAGNOSIS — K50018 Crohn's disease of small intestine with other complication: Secondary | ICD-10-CM | POA: Diagnosis not present

## 2021-09-19 DIAGNOSIS — K50819 Crohn's disease of both small and large intestine with unspecified complications: Secondary | ICD-10-CM | POA: Diagnosis not present

## 2021-09-19 DIAGNOSIS — B999 Unspecified infectious disease: Secondary | ICD-10-CM | POA: Diagnosis not present

## 2021-09-21 DIAGNOSIS — L02211 Cutaneous abscess of abdominal wall: Secondary | ICD-10-CM | POA: Diagnosis not present

## 2021-09-21 DIAGNOSIS — K50018 Crohn's disease of small intestine with other complication: Secondary | ICD-10-CM | POA: Diagnosis not present

## 2021-09-23 DIAGNOSIS — Z23 Encounter for immunization: Secondary | ICD-10-CM | POA: Diagnosis not present

## 2021-09-23 DIAGNOSIS — I2699 Other pulmonary embolism without acute cor pulmonale: Secondary | ICD-10-CM | POA: Diagnosis not present

## 2021-09-24 DIAGNOSIS — L02211 Cutaneous abscess of abdominal wall: Secondary | ICD-10-CM | POA: Diagnosis not present

## 2021-09-24 DIAGNOSIS — K50018 Crohn's disease of small intestine with other complication: Secondary | ICD-10-CM | POA: Diagnosis not present

## 2021-09-26 DIAGNOSIS — K50819 Crohn's disease of both small and large intestine with unspecified complications: Secondary | ICD-10-CM | POA: Diagnosis not present

## 2021-09-26 DIAGNOSIS — B999 Unspecified infectious disease: Secondary | ICD-10-CM | POA: Diagnosis not present

## 2021-10-01 DIAGNOSIS — K50018 Crohn's disease of small intestine with other complication: Secondary | ICD-10-CM | POA: Diagnosis not present

## 2021-10-01 DIAGNOSIS — L02211 Cutaneous abscess of abdominal wall: Secondary | ICD-10-CM | POA: Diagnosis not present

## 2021-10-04 DIAGNOSIS — K50018 Crohn's disease of small intestine with other complication: Secondary | ICD-10-CM | POA: Diagnosis not present

## 2021-10-04 DIAGNOSIS — L02211 Cutaneous abscess of abdominal wall: Secondary | ICD-10-CM | POA: Diagnosis not present

## 2021-10-08 DIAGNOSIS — L02211 Cutaneous abscess of abdominal wall: Secondary | ICD-10-CM | POA: Diagnosis not present

## 2021-10-08 DIAGNOSIS — K50018 Crohn's disease of small intestine with other complication: Secondary | ICD-10-CM | POA: Diagnosis not present

## 2021-10-10 DIAGNOSIS — B999 Unspecified infectious disease: Secondary | ICD-10-CM | POA: Diagnosis not present

## 2021-10-10 DIAGNOSIS — K50018 Crohn's disease of small intestine with other complication: Secondary | ICD-10-CM | POA: Diagnosis not present

## 2021-10-10 DIAGNOSIS — L02211 Cutaneous abscess of abdominal wall: Secondary | ICD-10-CM | POA: Diagnosis not present

## 2021-10-10 DIAGNOSIS — K50819 Crohn's disease of both small and large intestine with unspecified complications: Secondary | ICD-10-CM | POA: Diagnosis not present

## 2021-10-10 DIAGNOSIS — Z789 Other specified health status: Secondary | ICD-10-CM | POA: Diagnosis not present

## 2021-10-10 DIAGNOSIS — Z4803 Encounter for change or removal of drains: Secondary | ICD-10-CM | POA: Diagnosis not present

## 2021-10-10 DIAGNOSIS — Z87891 Personal history of nicotine dependence: Secondary | ICD-10-CM | POA: Diagnosis not present

## 2021-10-15 DIAGNOSIS — L02211 Cutaneous abscess of abdominal wall: Secondary | ICD-10-CM | POA: Diagnosis not present

## 2021-10-15 DIAGNOSIS — K50018 Crohn's disease of small intestine with other complication: Secondary | ICD-10-CM | POA: Diagnosis not present

## 2021-10-17 DIAGNOSIS — K50018 Crohn's disease of small intestine with other complication: Secondary | ICD-10-CM | POA: Diagnosis not present

## 2021-10-17 DIAGNOSIS — L02211 Cutaneous abscess of abdominal wall: Secondary | ICD-10-CM | POA: Diagnosis not present

## 2021-10-22 DIAGNOSIS — K50018 Crohn's disease of small intestine with other complication: Secondary | ICD-10-CM | POA: Diagnosis not present

## 2021-10-22 DIAGNOSIS — L02211 Cutaneous abscess of abdominal wall: Secondary | ICD-10-CM | POA: Diagnosis not present

## 2021-10-24 DIAGNOSIS — L02211 Cutaneous abscess of abdominal wall: Secondary | ICD-10-CM | POA: Diagnosis not present

## 2021-10-24 DIAGNOSIS — K50819 Crohn's disease of both small and large intestine with unspecified complications: Secondary | ICD-10-CM | POA: Diagnosis not present

## 2021-10-24 DIAGNOSIS — K50018 Crohn's disease of small intestine with other complication: Secondary | ICD-10-CM | POA: Diagnosis not present

## 2021-10-29 DIAGNOSIS — K50018 Crohn's disease of small intestine with other complication: Secondary | ICD-10-CM | POA: Diagnosis not present

## 2021-10-29 DIAGNOSIS — L02211 Cutaneous abscess of abdominal wall: Secondary | ICD-10-CM | POA: Diagnosis not present

## 2021-11-04 DIAGNOSIS — H35362 Drusen (degenerative) of macula, left eye: Secondary | ICD-10-CM | POA: Diagnosis not present

## 2021-11-04 DIAGNOSIS — H2513 Age-related nuclear cataract, bilateral: Secondary | ICD-10-CM | POA: Diagnosis not present

## 2021-11-19 DIAGNOSIS — K50819 Crohn's disease of both small and large intestine with unspecified complications: Secondary | ICD-10-CM | POA: Diagnosis not present

## 2021-11-27 IMAGING — CT CT ABD-PELV W/ CM
2 of 5 series · 15 of 46 positions shown, 17 images · IV contrast (APPLIED)
Comparison: Prior studies from 7666.

CLINICAL DATA: Bowel obstruction suspected, history of inflammatory
bowel disease with abdominal pain

EXAM:
CT ABDOMEN AND PELVIS WITH CONTRAST
TECHNIQUE: Multidetector CT imaging of the abdomen and pelvis was performed
using the standard protocol following bolus administration of
intravenous contrast.
CONTRAST:  75mL OMNIPAQUE IOHEXOL 300 MG/ML  SOLN

[Series 2: routine abd/pel with · axial · 0.76mm/px · z∈[-450,-85]mm · 12 of 83 slices shown, 14 images]
[im 5/83  soft-tissue]
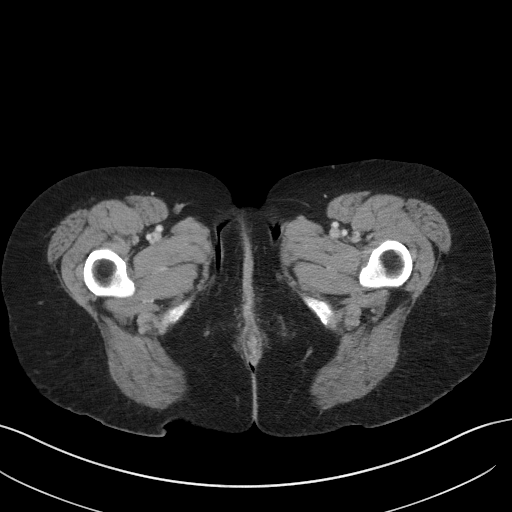
[im 5/83  bone]
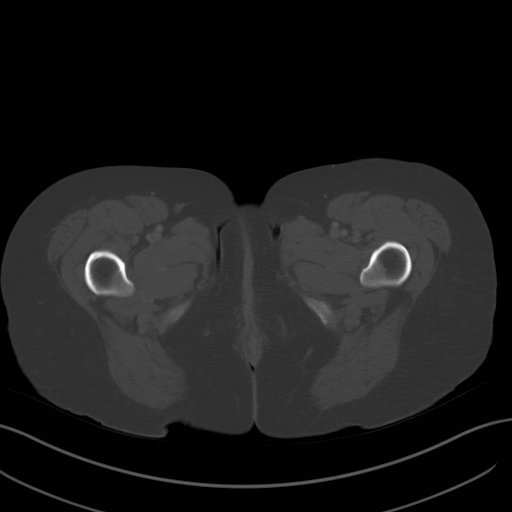
[im 14/83  soft-tissue]
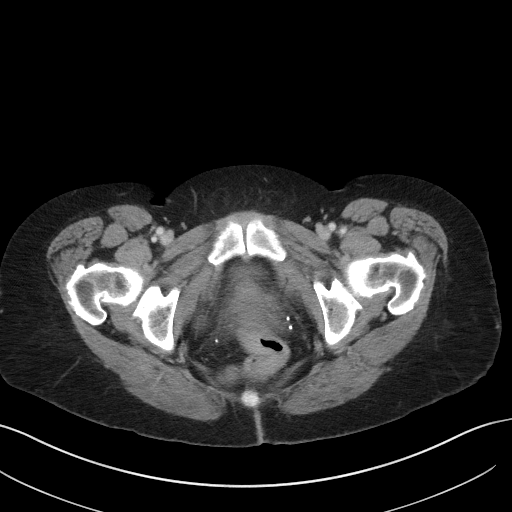
[im 19/83  soft-tissue]
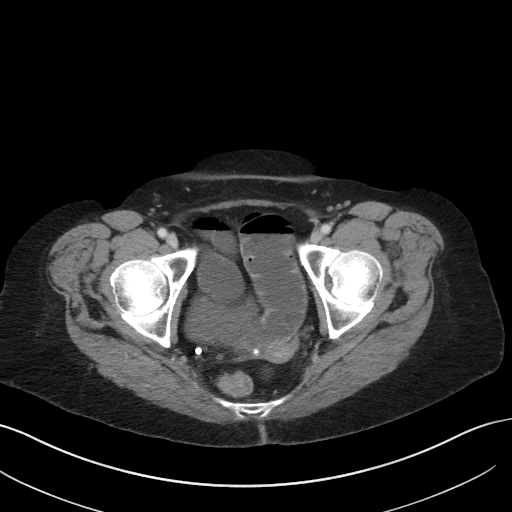
[im 23/83  soft-tissue]
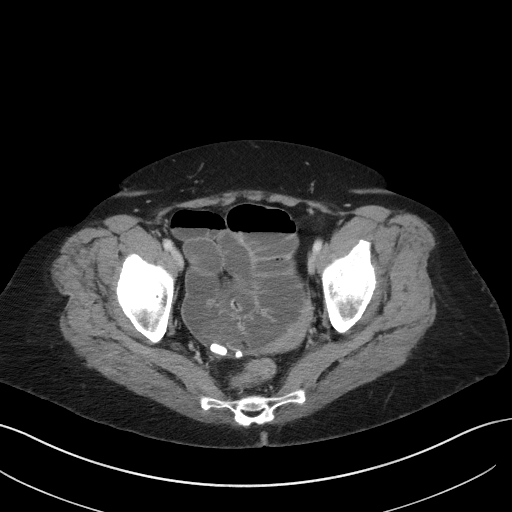
[im 32/83  soft-tissue]
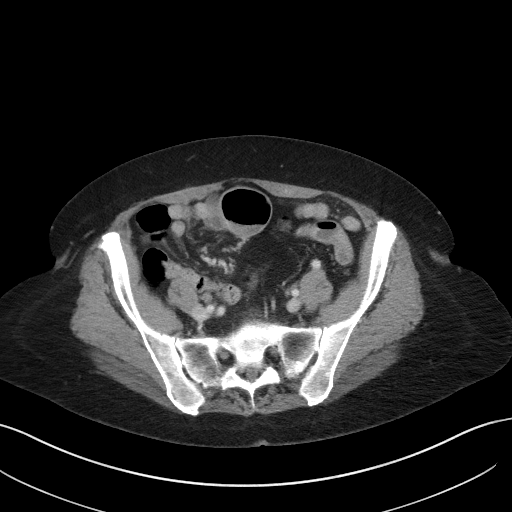
[im 37/83  soft-tissue]
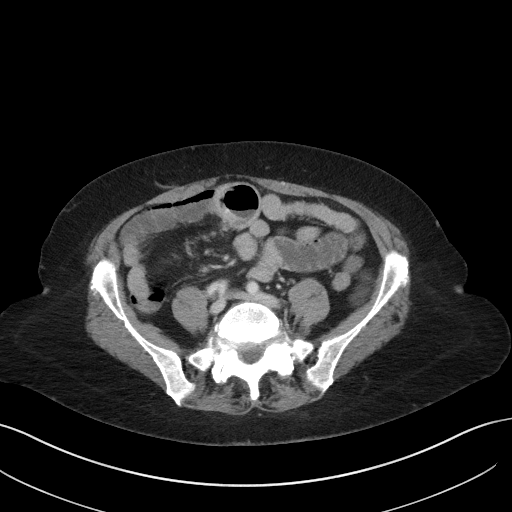
[im 46/83  soft-tissue]
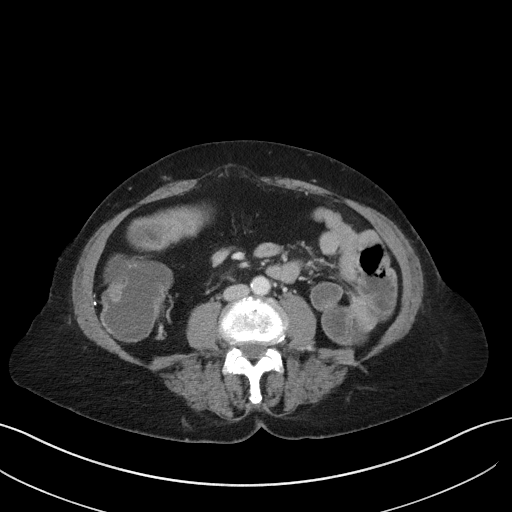
[im 51/83  soft-tissue]
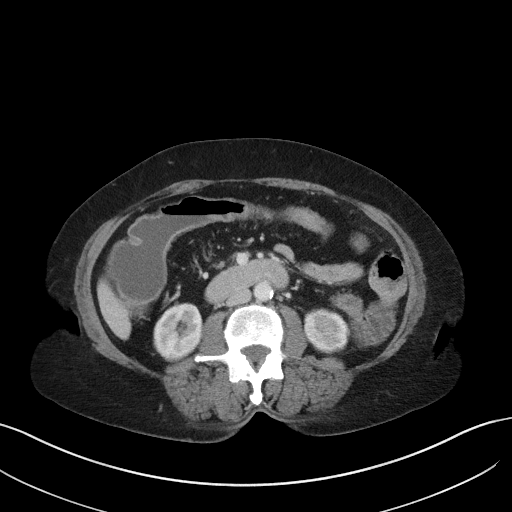
[im 60/83  soft-tissue]
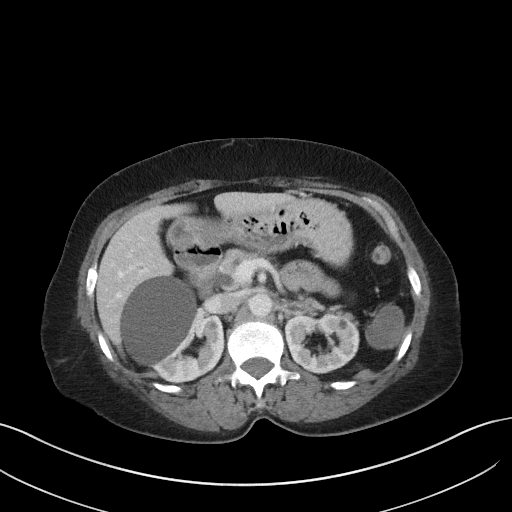
[im 60/83  bone]
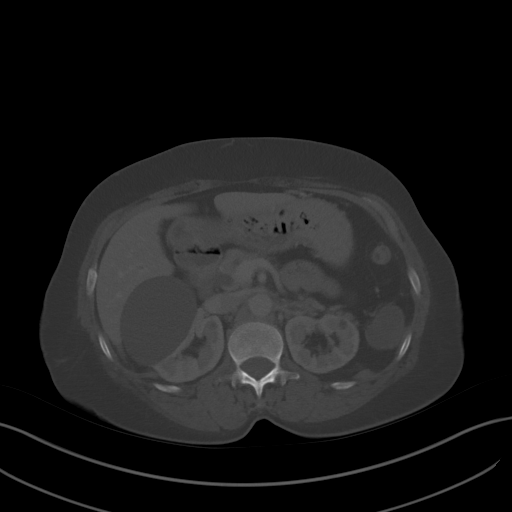
[im 64/83  soft-tissue]
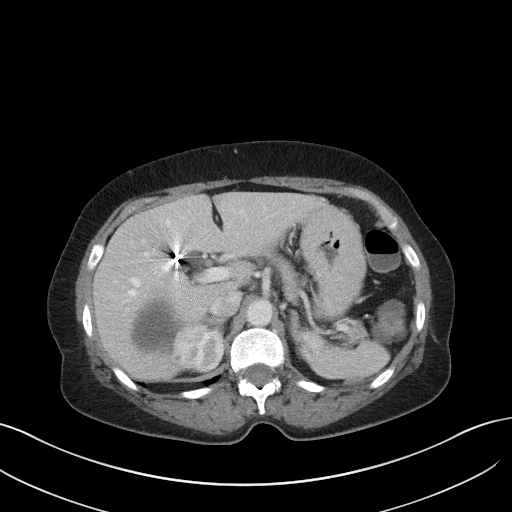
[im 69/83  soft-tissue]
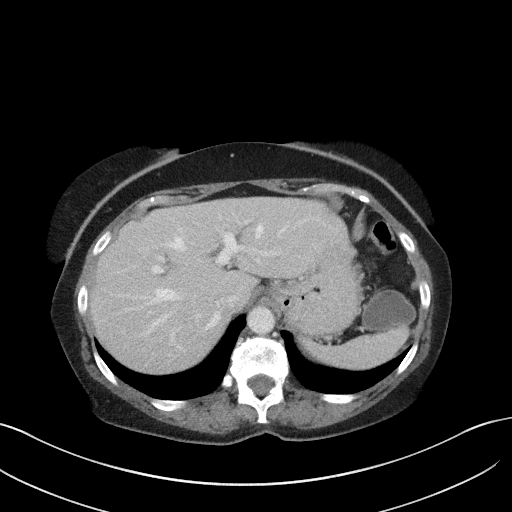
[im 78/83  soft-tissue]
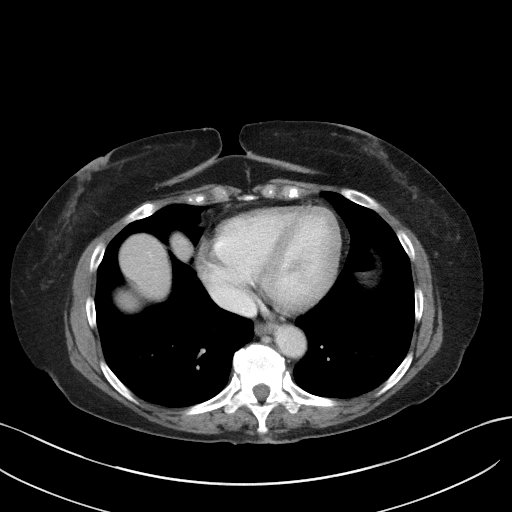

[Series 5: coronal st · coronal · 0.72mm/px · 3 of 84 slices shown]
[im 28/84  soft-tissue]
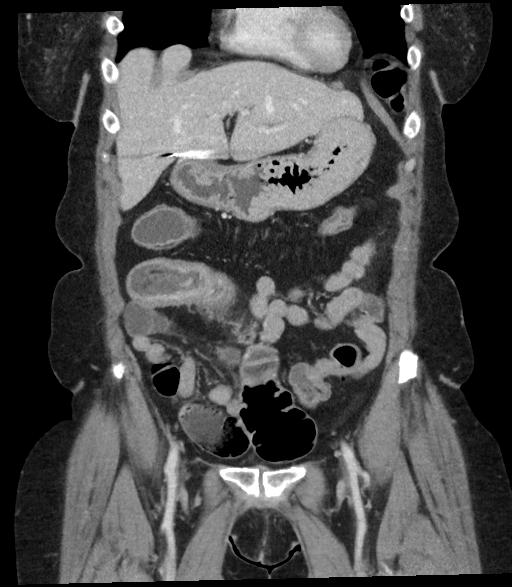
[im 37/84  soft-tissue]
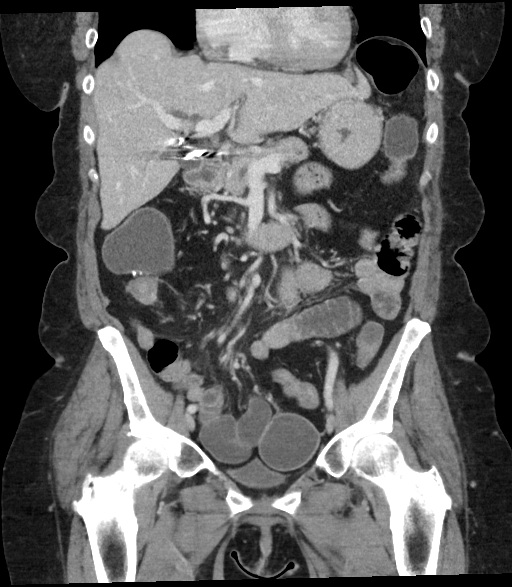
[im 47/84  soft-tissue]
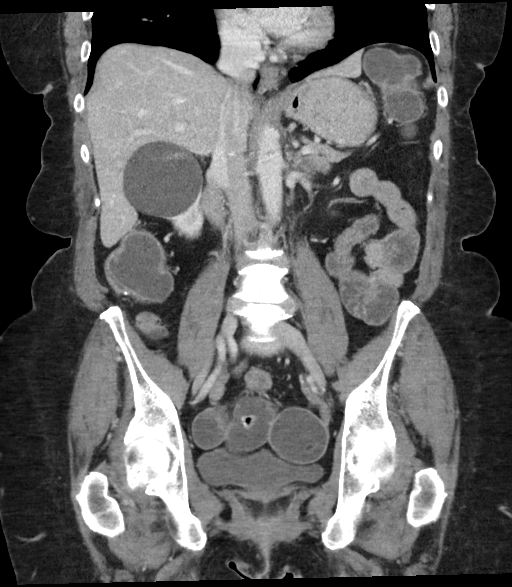

[15 of 46 positions shown; findings below may reference images not displayed]

FINDINGS: Lower chest: Incidental imaging of the lung bases without effusion
or consolidation.

Hepatobiliary: Post cholecystectomy with mild biliary duct
distension this is increased since 7666 particularly with respect
intrahepatic ducts with there is moderate intrahepatic biliary duct
dilation of LEFT and RIGHT hepatic ducts. No focal, suspicious
hepatic lesion. The portal vein is patent.

Pancreas: Normal, without mass, inflammation or ductal dilatation.

Spleen: Normal

Adrenals/Urinary Tract: Under distended stomach limiting assessment.
Question gastric thickening though similar appearance seen on
previous imaging.

Post ileocecal resection with long segment narrowing and mural
stratification of the neoterminal ileum on image 41 of series 2 this
measures approximately 3.6 cm greatest length with 4.2 cm upstream
bowel dilation in the distal ileum. Signs of colonic thickening of
the transverse colon with mural stratification. No colonic dilation.
Bowel loops become gradually less distended with passing from distal
to proximal. Jejunal loops are decompressed without signs of
inflammation. There is evidence of open "creeping fat" about the
affected segment of small bowel in the RIGHT lower quadrant.

Vascular/Lymphatic: Portal vein again is patent. The IVC is smooth
and normal caliber. No aneurysmal dilation of the abdominal aorta.
Calcified and noncalcified plaque in the abdominal aorta. There is
no gastrohepatic or hepatoduodenal ligament lymphadenopathy. No
retroperitoneal or mesenteric lymphadenopathy. Scattered small
lymph nodes in the involved mesentery in the RIGHT lower quadrant.

No pelvic sidewall lymphadenopathy.

Reproductive: Unremarkable

Other: No ascites. No abscess. Small fistula from the anastomotic
site 2 adjacent small bowel best seen on image 45 of series 5 also
seen on sagittal images. Is unclear whether this is an active
fistula or a tract from previous fistulization but there is bridging
between these 2 structures at the site of presumed stricture.

Musculoskeletal: No acute bone finding or destructive bone process.
No signs of avascular necrosis of the femoral heads. Spinal
degenerative changes.
IMPRESSION: 1. Stigmata of suspected acute on chronic Crohn's disease with
stricture and potential fistula associated with bowel obstruction
though more proximal loops of small bowel are decompressed.
2. Bowel dilation may have a chronic component though is worse than
Severity of obstruction may be exacerbated by an overlay of acute on
chronic inflammatory bowel disease. GI consultation is suggested.
3. Suspected skip lesions in the colon with signs of colonic Crohn's
disease.
4. Increasing biliary duct dilation with intrahepatic biliary duct
distension in this patient with history of inflammatory bowel
disease remains nonspecific following cholecystectomy but should be
correlated with laboratory values to determine whether MRCP may be
helpful and there is any clinical concern for PSC.
5. Aortic atherosclerosis.

## 2021-12-03 DIAGNOSIS — K50819 Crohn's disease of both small and large intestine with unspecified complications: Secondary | ICD-10-CM | POA: Diagnosis not present

## 2021-12-03 DIAGNOSIS — J449 Chronic obstructive pulmonary disease, unspecified: Secondary | ICD-10-CM | POA: Diagnosis not present

## 2021-12-03 DIAGNOSIS — I10 Essential (primary) hypertension: Secondary | ICD-10-CM | POA: Diagnosis not present

## 2021-12-03 DIAGNOSIS — N1831 Chronic kidney disease, stage 3a: Secondary | ICD-10-CM | POA: Diagnosis not present

## 2021-12-03 DIAGNOSIS — Z789 Other specified health status: Secondary | ICD-10-CM | POA: Diagnosis not present

## 2021-12-03 DIAGNOSIS — Z0000 Encounter for general adult medical examination without abnormal findings: Secondary | ICD-10-CM | POA: Diagnosis not present

## 2021-12-03 DIAGNOSIS — I2699 Other pulmonary embolism without acute cor pulmonale: Secondary | ICD-10-CM | POA: Diagnosis not present

## 2021-12-04 ENCOUNTER — Other Ambulatory Visit: Payer: Self-pay | Admitting: Internal Medicine

## 2021-12-04 DIAGNOSIS — Z1231 Encounter for screening mammogram for malignant neoplasm of breast: Secondary | ICD-10-CM

## 2021-12-05 DIAGNOSIS — K50819 Crohn's disease of both small and large intestine with unspecified complications: Secondary | ICD-10-CM | POA: Diagnosis not present

## 2022-01-23 DIAGNOSIS — H2512 Age-related nuclear cataract, left eye: Secondary | ICD-10-CM | POA: Diagnosis not present

## 2022-01-30 DIAGNOSIS — K50819 Crohn's disease of both small and large intestine with unspecified complications: Secondary | ICD-10-CM | POA: Diagnosis not present

## 2022-02-04 DIAGNOSIS — N1831 Chronic kidney disease, stage 3a: Secondary | ICD-10-CM | POA: Diagnosis not present

## 2022-02-04 DIAGNOSIS — K50819 Crohn's disease of both small and large intestine with unspecified complications: Secondary | ICD-10-CM | POA: Diagnosis not present

## 2022-02-04 DIAGNOSIS — I10 Essential (primary) hypertension: Secondary | ICD-10-CM | POA: Diagnosis not present

## 2022-02-04 DIAGNOSIS — J449 Chronic obstructive pulmonary disease, unspecified: Secondary | ICD-10-CM | POA: Diagnosis not present

## 2022-02-04 DIAGNOSIS — I2699 Other pulmonary embolism without acute cor pulmonale: Secondary | ICD-10-CM | POA: Diagnosis not present

## 2022-02-10 ENCOUNTER — Encounter: Payer: Self-pay | Admitting: Ophthalmology

## 2022-02-13 ENCOUNTER — Ambulatory Visit: Payer: PPO | Admitting: Anesthesiology

## 2022-02-13 ENCOUNTER — Encounter: Payer: Self-pay | Admitting: Ophthalmology

## 2022-02-13 ENCOUNTER — Encounter
Admission: RE | Disposition: A | Discharge status: Home / Self Care | Payer: Self-pay | Source: Home / Self Care | Attending: Ophthalmology

## 2022-02-13 ENCOUNTER — Ambulatory Visit
Admission: RE | Admit: 2022-02-13 | Discharge: 2022-02-13 | Disposition: A | Discharge status: Home / Self Care | Payer: PPO | Attending: Ophthalmology | Admitting: Ophthalmology

## 2022-02-13 ENCOUNTER — Other Ambulatory Visit: Payer: Self-pay

## 2022-02-13 DIAGNOSIS — N183 Chronic kidney disease, stage 3 unspecified: Secondary | ICD-10-CM | POA: Insufficient documentation

## 2022-02-13 DIAGNOSIS — J449 Chronic obstructive pulmonary disease, unspecified: Secondary | ICD-10-CM | POA: Insufficient documentation

## 2022-02-13 DIAGNOSIS — Z87891 Personal history of nicotine dependence: Secondary | ICD-10-CM | POA: Insufficient documentation

## 2022-02-13 DIAGNOSIS — Z86718 Personal history of other venous thrombosis and embolism: Secondary | ICD-10-CM | POA: Insufficient documentation

## 2022-02-13 DIAGNOSIS — Z7901 Long term (current) use of anticoagulants: Secondary | ICD-10-CM | POA: Diagnosis not present

## 2022-02-13 DIAGNOSIS — H2512 Age-related nuclear cataract, left eye: Secondary | ICD-10-CM | POA: Insufficient documentation

## 2022-02-13 DIAGNOSIS — E876 Hypokalemia: Secondary | ICD-10-CM | POA: Diagnosis not present

## 2022-02-13 DIAGNOSIS — Z79899 Other long term (current) drug therapy: Secondary | ICD-10-CM | POA: Diagnosis not present

## 2022-02-13 DIAGNOSIS — H269 Unspecified cataract: Secondary | ICD-10-CM | POA: Diagnosis not present

## 2022-02-13 DIAGNOSIS — K5090 Crohn's disease, unspecified, without complications: Secondary | ICD-10-CM | POA: Diagnosis not present

## 2022-02-13 DIAGNOSIS — I129 Hypertensive chronic kidney disease with stage 1 through stage 4 chronic kidney disease, or unspecified chronic kidney disease: Secondary | ICD-10-CM | POA: Diagnosis not present

## 2022-02-13 HISTORY — PX: CATARACT EXTRACTION W/PHACO: SHX586

## 2022-02-13 SURGERY — PHACOEMULSIFICATION, CATARACT, WITH IOL INSERTION
Anesthesia: Monitor Anesthesia Care | Site: Eye | Laterality: Left

## 2022-02-13 MED ORDER — EPINEPHRINE PF 1 MG/ML IJ SOLN
INTRAMUSCULAR | Status: DC | PRN
  Administered 2022-02-13: 71 mL via OPHTHALMIC

## 2022-02-13 MED ORDER — ACETAMINOPHEN 160 MG/5ML PO SOLN
325.0000 mg | ORAL | Status: DC | PRN
  Filled 2022-02-13: qty 20.3

## 2022-02-13 MED ORDER — FENTANYL CITRATE (PF) 100 MCG/2ML IJ SOLN
INTRAMUSCULAR | Status: AC
  Filled 2022-02-13: qty 2

## 2022-02-13 MED ORDER — MOXIFLOXACIN HCL 0.5 % OP SOLN
1.0000 [drp] | Freq: Once | OPHTHALMIC | Status: DC
Start: 2022-02-13 — End: 2022-02-13

## 2022-02-13 MED ORDER — MOXIFLOXACIN HCL 0.5 % OP SOLN
OPHTHALMIC | Status: AC
  Filled 2022-02-13: qty 3

## 2022-02-13 MED ORDER — ACETAMINOPHEN 325 MG PO TABS: 650.0000 mg | ORAL_TABLET | Freq: Once | ORAL | Status: DC | PRN

## 2022-02-13 MED ORDER — ARMC OPHTHALMIC DILATING DROPS: 1.0000 "application " | OPHTHALMIC | Status: DC | PRN

## 2022-02-13 MED ORDER — ARMC OPHTHALMIC DILATING DROPS
OPHTHALMIC | Status: AC
  Filled 2022-02-13: qty 0.5

## 2022-02-13 MED ORDER — ARMC OPHTHALMIC DILATING DROPS
1.0000 "application " | OPHTHALMIC | Status: AC
  Administered 2022-02-13 (×3): 1 via OPHTHALMIC

## 2022-02-13 MED ORDER — TETRACAINE HCL 0.5 % OP SOLN
1.0000 [drp] | Freq: Once | OPHTHALMIC | Status: AC
  Administered 2022-02-13: 1 [drp] via OPHTHALMIC

## 2022-02-13 MED ORDER — SIGHTPATH DOSE#1 NA CHONDROIT SULF-NA HYALURON 40-17 MG/ML IO SOLN
INTRAOCULAR | Status: DC | PRN
  Administered 2022-02-13: 1 mL via INTRAOCULAR

## 2022-02-13 MED ORDER — TETRACAINE HCL 0.5 % OP SOLN
OPHTHALMIC | Status: AC
  Administered 2022-02-13: 1 [drp] via OPHTHALMIC
  Filled 2022-02-13: qty 4

## 2022-02-13 MED ORDER — ONDANSETRON HCL 4 MG/2ML IJ SOLN: 4.0000 mg | Freq: Once | INTRAMUSCULAR | Status: DC | PRN

## 2022-02-13 MED ORDER — TETRACAINE HCL 0.5 % OP SOLN
1.0000 [drp] | OPHTHALMIC | Status: DC | PRN
Start: 2022-02-13 — End: 2022-02-13

## 2022-02-13 MED ORDER — FENTANYL CITRATE (PF) 100 MCG/2ML IJ SOLN
INTRAMUSCULAR | Status: DC | PRN
  Administered 2022-02-13: 50 ug via INTRAVENOUS

## 2022-02-13 MED ORDER — MOXIFLOXACIN HCL 0.5 % OP SOLN
OPHTHALMIC | Status: DC | PRN
  Administered 2022-02-13: 0.2 mL via OPHTHALMIC

## 2022-02-13 MED ORDER — SODIUM CHLORIDE 0.9 % IV SOLN: INTRAVENOUS | Status: DC

## 2022-02-13 MED ORDER — BRIMONIDINE TARTRATE-TIMOLOL 0.2-0.5 % OP SOLN
OPHTHALMIC | Status: DC | PRN
  Administered 2022-02-13: 1 [drp] via OPHTHALMIC

## 2022-02-13 MED ORDER — SIGHTPATH DOSE#1 BSS IO SOLN
INTRAOCULAR | Status: DC | PRN
  Administered 2022-02-13: 1 mL

## 2022-02-13 MED ORDER — MIDAZOLAM HCL 2 MG/2ML IJ SOLN
INTRAMUSCULAR | Status: DC | PRN
  Administered 2022-02-13: 1 mg via INTRAVENOUS

## 2022-02-13 MED ORDER — SIGHTPATH DOSE#1 BSS IO SOLN
INTRAOCULAR | Status: DC | PRN
Start: 2022-02-13 — End: 2022-02-13
  Administered 2022-02-13: 15 mL

## 2022-02-13 MED ORDER — MIDAZOLAM HCL 2 MG/2ML IJ SOLN
INTRAMUSCULAR | Status: AC
  Filled 2022-02-13: qty 2

## 2022-02-13 SURGICAL SUPPLY — 16 items
CATARACT SUITE SIGHTPATH (MISCELLANEOUS) ×2 IMPLANT
FEE CATARACT SUITE SIGHTPATH (MISCELLANEOUS) ×1 IMPLANT
GLOVE SURG ENC MOIS LTX SZ8 (GLOVE) ×2 IMPLANT
GLOVE SURG ENC TEXT LTX SZ6.5 (GLOVE) ×2 IMPLANT
GLOVE SURG ENC TEXT LTX SZ8 (GLOVE) ×2 IMPLANT
GOWN STRL REUS W/ TWL LRG LVL3 (GOWN DISPOSABLE) ×2 IMPLANT
GOWN STRL REUS W/TWL LRG LVL3 (GOWN DISPOSABLE) ×4
LENS IOL TECNIS EYHANCE 26.0 (Intraocular Lens) ×1 IMPLANT
NDL FILTER BLUNT 18X1 1/2 (NEEDLE) ×1 IMPLANT
NEEDLE FILTER BLUNT 18X 1/2SAF (NEEDLE) ×1
NEEDLE FILTER BLUNT 18X1 1/2 (NEEDLE) ×1 IMPLANT
SYR 3ML LL SCALE MARK (SYRINGE) ×2 IMPLANT
SYR 5ML LL (SYRINGE) IMPLANT
SYR TB 1ML 27GX1/2 LL (SYRINGE) ×2 IMPLANT
WATER STERILE IRR 250ML POUR (IV SOLUTION) ×2 IMPLANT
WIPE NON LINTING 3.25X3.25 (MISCELLANEOUS) IMPLANT

## 2022-02-13 NOTE — Discharge Instructions (Signed)
Eye Surgery Discharge Instructions ? ? ? ?Expect mild scratchy sensation or mild soreness. ?DO NOT RUB YOUR EYE! ? ?The day of surgery: ?Minimal physical activity, but bed rest is not required ?No reading, computer work, or close hand work ?No bending, lifting, or straining. ?May watch TV ? ?For 24 hours: ?No driving, legal decisions, or alcoholic beverages ?Safety precautions ?Eat anything you prefer: It is better to start with liquids, then soup then solid foods. ?_____ Eye patch should be worn until postoperative exam tomorrow. ?__x__ Solar shield eyeglasses should be worn for comfort in the sunlight/patch while sleeping ? ?Resume all regular medications including aspirin or Coumadin if these were discontinued prior to surgery. ?You may shower, bathe, shave, or wash your hair. ?Tylenol may be taken for mild discomfort. ? ?Call your doctor if you experience significant pain, nausea, or vomiting, fever > 101 or other signs of infection. [phone] or [phone] ?Specific instructions: ? ?  ?

## 2022-02-13 NOTE — Op Note (Signed)
PREOPERATIVE DIAGNOSIS:  Nuclear sclerotic cataract of the left eye. ?  ?POSTOPERATIVE DIAGNOSIS:  Nuclear sclerotic cataract of the left eye. ?  ?OPERATIVE PROCEDURE:ORPROCALL@ ?  ?SURGEON:  Birder Robson, MD. ?  ?ANESTHESIA: ? ?Anesthesiologist: Darrin Nipper, MD ?CRNA: Beverely [place], CRNA ? ?1.      Managed anesthesia care. ?2.     0.79m of Shugarcaine was instilled following the paracentesis ?  ?COMPLICATIONS:  None. ?  ?TECHNIQUE:   Stop and chop ?  ?DESCRIPTION OF PROCEDURE:  The patient was examined and consented in the preoperative holding area where the aforementioned topical anesthesia was applied to the left eye and then brought back to the Operating Room where the left eye was prepped and draped in the usual sterile ophthalmic fashion and a lid speculum was placed. A paracentesis was created with the side port blade and the anterior chamber was filled with viscoelastic. A near clear corneal incision was performed with the steel keratome. A continuous curvilinear capsulorrhexis was performed with a cystotome followed by the capsulorrhexis forceps. Hydrodissection and hydrodelineation were carried out with BSS on a blunt cannula. The lens was removed in a stop and chop  technique and the remaining cortical material was removed with the irrigation-aspiration handpiece. The capsular bag was inflated with viscoelastic and the Technis [id number] lens was placed in the capsular bag without complication. The remaining viscoelastic was removed from the eye with the irrigation-aspiration handpiece. The wounds were hydrated. The anterior chamber was flushed with BSS and the eye was inflated to physiologic pressure. 0.181mVigamox was placed in the anterior chamber. The wounds were found to be water tight. The eye was dressed with Combigan. The patient was given protective glasses to wear throughout the day and a shield with which to sleep tonight. The patient was also given drops with which to begin a drop regimen  today and will follow-up with me in one day. ?Implant Name Type Inv. Item Serial No. Manufacturer Lot No. LRB No. Used Action  ?TECNIS EYEHANCE IOL Intraocular Lens  [id number]  Left 1 Implanted  ?  ?Procedure(s) with comments: ?CATARACT EXTRACTION PHACO AND INTRAOCULAR LENS PLACEMENT (IOC) LEFT (Left) - 19.51 ?1:31.6 ? ?Electronically signed: WiBirder Robson/23/2023 10:49 AM ? ?

## 2022-02-13 NOTE — Anesthesia Postprocedure Evaluation (Signed)
Anesthesia Post Note ? ?Patient: Bridget Huber ? ?Procedure(s) Performed: CATARACT EXTRACTION PHACO AND INTRAOCULAR LENS PLACEMENT (IOC) LEFT (Left: Eye) ? ?Patient location during evaluation: PACU ?Anesthesia Type: MAC ?Level of consciousness: awake and alert, oriented and patient cooperative ?Pain management: pain level controlled ?Vital Signs Assessment: post-procedure vital signs reviewed and stable ?Respiratory status: spontaneous breathing, nonlabored ventilation and respiratory function stable ?Cardiovascular status: blood pressure returned to baseline and stable ?Postop Assessment: adequate PO intake ?Anesthetic complications: no ? ? ?No notable events documented. ? ? ?Last Vitals:  ?Vitals:  ? 02/13/22 0921 02/13/22 1058  ?BP: 131/72 137/73  ?Pulse: 76 69  ?Resp: 18 16  ?Temp: 36.4 ?C 36.7 ?C  ?SpO2: 100% 100%  ?  ?Last Pain:  ?Vitals:  ? 02/13/22 1058  ?TempSrc: Oral  ?PainSc: 0-No pain  ? ? ?  ?  ?  ?  ?  ?  ? ?Darrin Nipper ? ? ? ? ?

## 2022-02-13 NOTE — Anesthesia Preprocedure Evaluation (Signed)
Anesthesia Evaluation  ?Patient identified by MRN, date of birth, ID band ?Patient awake ? ? ? ?Reviewed: ?Allergy & Precautions, NPO status , Patient's Chart, lab work & pertinent test results ? ?History of Anesthesia Complications ?Negative for: history of anesthetic complications ? ?Airway ?Mallampati: II ? ? ?Neck ROM: Full ? ? ? Dental ? ? ?Missing molars:   ?Pulmonary ?former smoker (quit 2022),  ?  ?Pulmonary exam normal ?breath sounds clear to auscultation ? ? ? ? ? ? Cardiovascular ?hypertension, Normal cardiovascular exam ?Rhythm:Regular Rate:Normal ? ?Hx DVT and PE postop; last dose of Eliquis 02/12/22 ?  ?Neuro/Psych ?negative neurological ROS ?   ? GI/Hepatic ?Crohn's ?  ?Endo/Other  ?negative endocrine ROS ? Renal/GU ?Renal disease (stage III CKD; nephrolithiasis)  ? ?  ?Musculoskeletal ? ? Abdominal ?  ?Peds ? Hematology ?negative hematology ROS ?(+)   ?Anesthesia Other Findings ? ? Reproductive/Obstetrics ? ?  ? ? ? ? ? ? ? ? ? ? ? ? ? ?  ?  ? ? ? ? ? ? ? ? ?Anesthesia Physical ?Anesthesia Plan ? ?ASA: 2 ? ?Anesthesia Plan: MAC  ? ?Post-op Pain Management:   ? ?Induction: Intravenous ? ?PONV Risk Score and Plan: 1 and TIVA, Treatment may vary due to age or medical condition and Midazolam ? ?Airway Management Planned: Natural Airway ? ?Additional Equipment:  ? ?Intra-op Plan:  ? ?Post-operative Plan:  ? ?Informed Consent: I have reviewed the patients History and Physical, chart, labs and discussed the procedure including the risks, benefits and alternatives for the proposed anesthesia with the patient or authorized representative who has indicated his/her understanding and acceptance.  ? ? ? ? ? ?Plan Discussed with: CRNA ? ?Anesthesia Plan Comments: (LMA/GETA backup discussed.  Patient consented for risks of anesthesia including but not limited to:  ?- adverse reactions to medications ?- damage to eyes, teeth, lips or other oral mucosa ?- nerve damage due to  positioning  ?- sore throat or hoarseness ?- damage to heart, brain, nerves, lungs, other parts of body or loss of life ? ?Informed patient about role of CRNA in peri- and intra-operative care.  Patient voiced understanding.)  ? ? ? ? ? ? ?Anesthesia Quick Evaluation ? ?

## 2022-02-13 NOTE — H&P (Signed)
[place]  ? ?Primary Care Physician:  Kirk Ruths, MD ?Ophthalmologist: Dr. George Ina ? ?Pre-Procedure History & Physical: ?HPI:  Bridget Huber is a 71 y.o. female here for cataract surgery. ?  ?Past Medical History:  ?Diagnosis Date  ? Anemia   ? Arthritis   ? Chronic kidney disease   ? kidney stones  ? COPD (chronic obstructive pulmonary disease) ([place])   ? Crohn's disease ([place])   ? DVT (deep venous thrombosis) ([place])   ? Hypertension   ? ? ?Past Surgical History:  ?Procedure Laterality Date  ? COLONOSCOPY N/A 05/31/2015  ? Procedure: COLONOSCOPY;  Surgeon: Manya Silvas, MD;  Location: [facility] ENDOSCOPY;  Service: Endoscopy;  Laterality: N/A;  ? COLONOSCOPY WITH PROPOFOL N/A 03/21/2021  ? Procedure: COLONOSCOPY WITH PROPOFOL;  Surgeon: Lin Landsman, MD;  Location: [facility] ENDOSCOPY;  Service: Gastroenterology;  Laterality: N/A;  ? ROBOTIC REDO ILEOCOLECTOMY WITH ANASTOMOSIS  08/20/2021  ? ? ?Prior to Admission medications   ?Medication Sig Start Date End Date Taking? Authorizing Provider  ?apixaban (ELIQUIS) 5 MG TABS tablet Take 5 mg by mouth 2 (two) times daily.   Yes [provider]  ?azaTHIOprine (IMURAN) 50 MG tablet Take 50 mg by mouth daily.   Yes [provider]  ?Calcium Carbonate-Vitamin D 600-400 MG-UNIT tablet Take 1 tablet by mouth 2 (two) times daily.   Yes [provider]  ?Cholecalciferol 50 MCG (2000 UT) CAPS Take 2,000 Units by mouth daily.   Yes [provider]  ?cyanocobalamin 1000 MCG tablet Take 1,000 mcg by mouth daily.   Yes [provider]  ?ferrous sulfate 325 (65 FE) MG tablet Take 325 mg by mouth daily.   Yes [provider]  ?Multiple Vitamin (MULTI-VITAMIN) tablet Take 1 tablet by mouth daily.   Yes [provider]  ?omeprazole (PRILOSEC) 20 MG capsule Take 20 mg by mouth daily.   Yes [provider]  ?acetaminophen (TYLENOL) 650 MG CR tablet Take 1,300 mg by mouth every 8 (eight) hours as  needed for pain.    [provider]  ?inFLIXimab (REMICADE) 100 MG injection Inject into the vein. 07/17/21   [provider]  ?potassium chloride (KLOR-CON) 10 MEQ tablet Take 4 tablets (40 mEq total) by mouth daily for 7 days. Then go back to your usual 1 tab a day. 07/29/21 08/05/21  Enzo Bi, MD  ? ? ?Allergies as of 11/05/2021 - Review Complete 07/26/2021  ?Allergen Reaction Noted  ? Aspirin  05/30/2015  ? ? ?Family History  ?Problem Relation Age of Onset  ? Hypertension Brother   ? Diabetes Mellitus II Brother   ? ? ?Social History  ? ?Socioeconomic History  ? Marital status: Divorced  ?  Spouse name: Not on file  ? Number of children: Not on file  ? Years of education: Not on file  ? Highest education level: Not on file  ?Occupational History  ? Not on file  ?Tobacco Use  ? Smoking status: Every Day  ? Smokeless tobacco: Never  ?Substance and Sexual Activity  ? Alcohol use: Not Currently  ? Drug use: Never  ? Sexual activity: Not on file  ?Other Topics Concern  ? Not on file  ?Social History Narrative  ? Not on file  ? ?Social Determinants of Health  ? ?Financial Resource Strain: Not on file  ?Food Insecurity: Not on file  ?Transportation Needs: Not on file  ?Physical Activity: Not on file  ?Stress: Not on file  ?  Social Connections: Not on file  ?Intimate Partner Violence: Not on file  ? ? ?Review of Systems: ?See HPI, otherwise negative ROS ? ?Physical Exam: ?BP 137/73   Pulse 69   Temp 98 ?F (36.7 ?C) (Oral)   Resp 16   Ht '5\' 2"'$  (1.575 m)   Wt 77.1 kg   SpO2 100%   BMI 31.09 kg/m?  ?General:   Alert, cooperative in NAD ?Head:  Normocephalic and atraumatic. ?Respiratory:  Normal work of breathing. ?Cardiovascular:  RRR ? ?Impression/Plan: ?Bridget Huber is here for cataract surgery. ? ?Risks, benefits, limitations, and alternatives regarding cataract surgery have been reviewed with the patient.  Questions have been answered.  All parties agreeable. ? ? ?Birder Robson, MD   02/13/2022, 12:45 PM ? ?

## 2022-02-13 NOTE — Transfer of Care (Signed)
Immediate Anesthesia Transfer of Care Note ? ?Patient: Bridget Huber ? ?Procedure(s) Performed: CATARACT EXTRACTION PHACO AND INTRAOCULAR LENS PLACEMENT (IOC) LEFT (Left: Eye) ? ?Patient Location: PACU ? ?Anesthesia Type:MAC ? ?Level of Consciousness: awake ? ?Airway & Oxygen Therapy: Patient Spontanous Breathing ? ?Post-op Assessment: Report given to RN ? ?Post vital signs: Reviewed and stable ? ?Last Vitals:  ?Vitals Value Taken Time  ?BP    ?Temp    ?Pulse    ?Resp    ?SpO2    ? ? ?Last Pain:  ?Vitals:  ? 02/13/22 0921  ?TempSrc: Temporal  ?PainSc: 0-No pain  ?   ? ?  ? ?Complications: No notable events documented. ?

## 2022-02-18 ENCOUNTER — Encounter: Payer: Self-pay | Admitting: Ophthalmology

## 2022-02-20 NOTE — Discharge Instructions (Signed)

## 2022-02-21 DIAGNOSIS — H2511 Age-related nuclear cataract, right eye: Secondary | ICD-10-CM | POA: Diagnosis not present

## 2022-02-25 ENCOUNTER — Ambulatory Visit: Payer: PPO | Admitting: Anesthesiology

## 2022-02-25 ENCOUNTER — Ambulatory Visit
Admission: RE | Admit: 2022-02-25 | Discharge: 2022-02-25 | Disposition: A | Discharge status: Home / Self Care | Payer: PPO | Attending: Ophthalmology | Admitting: Ophthalmology

## 2022-02-25 ENCOUNTER — Other Ambulatory Visit: Payer: Self-pay

## 2022-02-25 ENCOUNTER — Encounter: Payer: Self-pay | Admitting: Ophthalmology

## 2022-02-25 ENCOUNTER — Encounter
Admission: RE | Disposition: A | Discharge status: Home / Self Care | Payer: Self-pay | Source: Home / Self Care | Attending: Ophthalmology

## 2022-02-25 DIAGNOSIS — H2511 Age-related nuclear cataract, right eye: Secondary | ICD-10-CM | POA: Diagnosis not present

## 2022-02-25 DIAGNOSIS — Z87891 Personal history of nicotine dependence: Secondary | ICD-10-CM | POA: Insufficient documentation

## 2022-02-25 DIAGNOSIS — I129 Hypertensive chronic kidney disease with stage 1 through stage 4 chronic kidney disease, or unspecified chronic kidney disease: Secondary | ICD-10-CM | POA: Diagnosis not present

## 2022-02-25 DIAGNOSIS — Z7901 Long term (current) use of anticoagulants: Secondary | ICD-10-CM | POA: Insufficient documentation

## 2022-02-25 DIAGNOSIS — N183 Chronic kidney disease, stage 3 unspecified: Secondary | ICD-10-CM | POA: Insufficient documentation

## 2022-02-25 DIAGNOSIS — Z86718 Personal history of other venous thrombosis and embolism: Secondary | ICD-10-CM | POA: Diagnosis not present

## 2022-02-25 DIAGNOSIS — H25811 Combined forms of age-related cataract, right eye: Secondary | ICD-10-CM | POA: Diagnosis not present

## 2022-02-25 HISTORY — PX: CATARACT EXTRACTION W/PHACO: SHX586

## 2022-02-25 SURGERY — PHACOEMULSIFICATION, CATARACT, WITH IOL INSERTION
Anesthesia: Monitor Anesthesia Care | Site: Eye | Laterality: Right

## 2022-02-25 MED ORDER — BRIMONIDINE TARTRATE-TIMOLOL 0.2-0.5 % OP SOLN
OPHTHALMIC | Status: DC | PRN
Start: 2022-02-25 — End: 2022-02-25
  Administered 2022-02-25: 1 [drp] via OPHTHALMIC

## 2022-02-25 MED ORDER — ACETAMINOPHEN 325 MG PO TABS: 325.0000 mg | ORAL_TABLET | ORAL | Status: DC | PRN

## 2022-02-25 MED ORDER — SIGHTPATH DOSE#1 BSS IO SOLN
INTRAOCULAR | Status: DC | PRN
  Administered 2022-02-25: 54 mL via OPHTHALMIC

## 2022-02-25 MED ORDER — SIGHTPATH DOSE#1 BSS IO SOLN
INTRAOCULAR | Status: DC | PRN
  Administered 2022-02-25: 2 mL

## 2022-02-25 MED ORDER — ARMC OPHTHALMIC DILATING DROPS
1.0000 "application " | OPHTHALMIC | Status: DC | PRN
  Administered 2022-02-25 (×3): 1 via OPHTHALMIC

## 2022-02-25 MED ORDER — SIGHTPATH DOSE#1 NA CHONDROIT SULF-NA HYALURON 40-17 MG/ML IO SOLN
INTRAOCULAR | Status: DC | PRN
Start: 2022-02-25 — End: 2022-02-25
  Administered 2022-02-25: 1 mL via INTRAOCULAR

## 2022-02-25 MED ORDER — ONDANSETRON HCL 4 MG/2ML IJ SOLN: 4.0000 mg | Freq: Once | INTRAMUSCULAR | Status: DC | PRN

## 2022-02-25 MED ORDER — MOXIFLOXACIN HCL 0.5 % OP SOLN
OPHTHALMIC | Status: DC | PRN
  Administered 2022-02-25: 0.2 mL via OPHTHALMIC

## 2022-02-25 MED ORDER — SIGHTPATH DOSE#1 BSS IO SOLN
INTRAOCULAR | Status: DC | PRN
  Administered 2022-02-25: 15 mL via INTRAOCULAR

## 2022-02-25 MED ORDER — MIDAZOLAM HCL 2 MG/2ML IJ SOLN
INTRAMUSCULAR | Status: DC | PRN
  Administered 2022-02-25: 2 mg via INTRAVENOUS

## 2022-02-25 MED ORDER — FENTANYL CITRATE (PF) 100 MCG/2ML IJ SOLN
INTRAMUSCULAR | Status: DC | PRN
  Administered 2022-02-25 (×2): 50 ug via INTRAVENOUS

## 2022-02-25 MED ORDER — ACETAMINOPHEN 160 MG/5ML PO SOLN: 325.0000 mg | ORAL | Status: DC | PRN

## 2022-02-25 MED ORDER — TETRACAINE HCL 0.5 % OP SOLN
1.0000 [drp] | OPHTHALMIC | Status: DC | PRN
  Administered 2022-02-25 (×3): 1 [drp] via OPHTHALMIC

## 2022-02-25 SURGICAL SUPPLY — 12 items
CANNULA ANT/CHMB 27G (MISCELLANEOUS) IMPLANT
CANNULA ANT/CHMB 27GA (MISCELLANEOUS) IMPLANT
CATARACT SUITE SIGHTPATH (MISCELLANEOUS) ×2 IMPLANT
FEE CATARACT SUITE SIGHTPATH (MISCELLANEOUS) ×1 IMPLANT
GLOVE SURG ENC TEXT LTX SZ8 (GLOVE) ×2 IMPLANT
GLOVE SURG TRIUMPH 8.0 PF LTX (GLOVE) ×2 IMPLANT
LENS IOL TECNIS EYHANCE 25.0 (Intraocular Lens) ×1 IMPLANT
NDL FILTER BLUNT 18X1 1/2 (NEEDLE) ×1 IMPLANT
NEEDLE FILTER BLUNT 18X 1/2SAF (NEEDLE) ×1
NEEDLE FILTER BLUNT 18X1 1/2 (NEEDLE) ×1 IMPLANT
SYR 3ML LL SCALE MARK (SYRINGE) ×2 IMPLANT
WATER STERILE IRR 250ML POUR (IV SOLUTION) ×2 IMPLANT

## 2022-02-25 NOTE — Anesthesia Postprocedure Evaluation (Signed)
Anesthesia Post Note ? ?Patient: Bridget Huber ? ?Procedure(s) Performed: CATARACT EXTRACTION PHACO AND INTRAOCULAR LENS PLACEMENT (IOC) RIGHT 14.23 01:08.3 (Right: Eye) ? ? ?  ?Patient location during evaluation: PACU ?Anesthesia Type: MAC ?Level of consciousness: awake ?Pain management: pain level controlled ?Vital Signs Assessment: post-procedure vital signs reviewed and stable ?Respiratory status: respiratory function stable ?Cardiovascular status: stable ?Postop Assessment: no apparent nausea or vomiting ?Anesthetic complications: no ? ? ?No notable events documented. ? ?Veda Canning ? ? ? ? ? ?

## 2022-02-25 NOTE — Transfer of Care (Signed)
Immediate Anesthesia Transfer of Care Note ? ?Patient: Bridget Huber ? ?Procedure(s) Performed: CATARACT EXTRACTION PHACO AND INTRAOCULAR LENS PLACEMENT (IOC) RIGHT (Right: Eye) ? ?Patient Location: PACU ? ?Anesthesia Type: MAC ? ?Level of Consciousness: awake, alert  and patient cooperative ? ?Airway and Oxygen Therapy: Patient Spontanous Breathing and Patient connected to supplemental oxygen ? ?Post-op Assessment: Post-op Vital signs reviewed, Patient's Cardiovascular Status Stable, Respiratory Function Stable, Patent Airway and No signs of Nausea or vomiting ? ?Post-op Vital Signs: Reviewed and stable ? ?Complications: No notable events documented. ? ?

## 2022-02-25 NOTE — H&P (Signed)
[place]  ? ?Primary Care Physician:  Kirk Ruths, MD ?Ophthalmologist: Dr.Khalil Belote ? ?Pre-Procedure History & Physical: ?HPI:  Bridget Huber is a 71 y.o. female here for cataract surgery. ?  ?Past Medical History:  ?Diagnosis Date  ? Anemia   ? Arthritis   ? Chronic kidney disease   ? kidney stones  ? COPD (chronic obstructive pulmonary disease) ([place])   ? Crohn's disease ([place])   ? DVT (deep venous thrombosis) ([place]) 2005  ? left lower extremity  ? Hypertension   ? ? ?Past Surgical History:  ?Procedure Laterality Date  ? CATARACT EXTRACTION W/PHACO Left 02/13/2022  ? Procedure: CATARACT EXTRACTION PHACO AND INTRAOCULAR LENS PLACEMENT ([place]) LEFT;  Surgeon: Birder Robson, MD;  Location: [facility] ORS;  Service: Ophthalmology;  Laterality: Left;  19.51 ?1:31.6  ? COLONOSCOPY N/A 05/31/2015  ? Procedure: COLONOSCOPY;  Surgeon: Manya Silvas, MD;  Location: [facility] ENDOSCOPY;  Service: Endoscopy;  Laterality: N/A;  ? COLONOSCOPY WITH PROPOFOL N/A 03/21/2021  ? Procedure: COLONOSCOPY WITH PROPOFOL;  Surgeon: Lin Landsman, MD;  Location: [facility] ENDOSCOPY;  Service: Gastroenterology;  Laterality: N/A;  ? ROBOTIC REDO ILEOCOLECTOMY WITH ANASTOMOSIS  08/20/2021  ? ? ?Prior to Admission medications   ?Medication Sig Start Date End Date Taking? Authorizing Provider  ?acetaminophen (TYLENOL) 650 MG CR tablet Take 1,300 mg by mouth every 8 (eight) hours as needed for pain.   Yes [provider]  ?apixaban (ELIQUIS) 5 MG TABS tablet Take 5 mg by mouth 2 (two) times daily.   Yes [provider]  ?azaTHIOprine (IMURAN) 50 MG tablet Take 50 mg by mouth daily.   Yes [provider]  ?Calcium Carbonate-Vitamin D 600-400 MG-UNIT tablet Take 1 tablet by mouth 2 (two) times daily.   Yes [provider]  ?Cholecalciferol 50 MCG (2000 UT) CAPS Take 2,000 Units by mouth daily.   Yes [provider]  ?cyanocobalamin 1000 MCG tablet Take 1,000 mcg by mouth daily.   Yes  [provider]  ?ferrous sulfate 325 (65 FE) MG tablet Take 325 mg by mouth daily.   Yes [provider]  ?furosemide (LASIX) 20 MG tablet Take 20 mg by mouth daily as needed.   Yes [provider]  ?inFLIXimab (REMICADE) 100 MG injection Inject into the vein. 07/17/21  Yes [provider]  ?Multiple Vitamin (MULTI-VITAMIN) tablet Take 1 tablet by mouth daily.   Yes [provider]  ?omeprazole (PRILOSEC) 20 MG capsule Take 20 mg by mouth daily.   Yes [provider]  ?potassium chloride (KLOR-CON) 10 MEQ tablet Take 4 tablets (40 mEq total) by mouth daily for 7 days. Then go back to your usual 1 tab a day. 07/29/21 08/05/21  Enzo Bi, MD  ? ? ?Allergies as of 01/23/2022 - Review Complete 07/26/2021  ?Allergen Reaction Noted  ? Aspirin  05/30/2015  ? ? ?Family History  ?Problem Relation Age of Onset  ? Hypertension Brother   ? Diabetes Mellitus II Brother   ? ? ?Social History  ? ?Socioeconomic History  ? Marital status: Divorced  ?  Spouse name: Not on file  ? Number of children: Not on file  ? Years of education: Not on file  ? Highest education level: Not on file  ?Occupational History  ? Not on file  ?Tobacco Use  ? Smoking status: Former  ?  Packs/day: 0.50  ?  Years: 32.00  ?  Pack years: 16.00  ?  Types: Cigarettes  ?  Quit date: 02/2021  ?  Years since quitting: 1.0  ? Smokeless tobacco: Never  ?Vaping Use  ? Vaping Use: Never used  ?Substance and Sexual Activity  ? Alcohol use: Not Currently  ? Drug use: Never  ? Sexual activity: Not on file  ?Other Topics Concern  ? Not on file  ?Social History Narrative  ? Not on file  ? ?Social Determinants of Health  ? ?Financial Resource Strain: Not on file  ?Food Insecurity: Not on file  ?Transportation Needs: Not on file  ?Physical Activity: Not on file  ?Stress: Not on file  ?Social Connections: Not on file  ?Intimate Partner Violence: Not on file  ? ? ?Review of Systems: ?See HPI, otherwise negative  ROS ? ?Physical Exam: ?BP (!) 154/80   Pulse 98   Temp 98 ?F (36.7 ?C) (Temporal)   Resp 18   Ht '5\' 2"'$  (1.575 m)   Wt 72.1 kg   SpO2 99%   BMI 29.08 kg/m?  ?General:   Alert, cooperative in NAD ?Head:  Normocephalic and atraumatic. ?Respiratory:  Normal work of breathing. ?Cardiovascular:  RRR ? ?Impression/Plan: ?Bridget Huber is here for cataract surgery. ? ?Risks, benefits, limitations, and alternatives regarding cataract surgery have been reviewed with the patient.  Questions have been answered.  All parties agreeable. ? ? ?Birder Robson, MD  02/25/2022, 11:41 AM ? ? ?

## 2022-02-25 NOTE — Op Note (Signed)
PREOPERATIVE DIAGNOSIS:  Nuclear sclerotic cataract of the right eye. ?  ?POSTOPERATIVE DIAGNOSIS:  Cataract ?  ?OPERATIVE PROCEDURE:ORPROCALL@ ?  ?SURGEON:  Birder Robson, MD. ?  ?ANESTHESIA: ? ?Anesthesiologist: Veda Canning, MD ?CRNA: Jeannene Patella, CRNA ? ?1.      Managed anesthesia care. ?2.      0.110m of Shugarcaine was instilled in the eye following the paracentesis. ?  ?COMPLICATIONS:  None. ?  ?TECHNIQUE:   Stop and chop ?  ?DESCRIPTION OF PROCEDURE:  The patient was examined and consented in the preoperative holding area where the aforementioned topical anesthesia was applied to the right eye and then brought back to the Operating Room where the right eye was prepped and draped in the usual sterile ophthalmic fashion and a lid speculum was placed. A paracentesis was created with the side port blade and the anterior chamber was filled with viscoelastic. A near clear corneal incision was performed with the steel keratome. A continuous curvilinear capsulorrhexis was performed with a cystotome followed by the capsulorrhexis forceps. Hydrodissection and hydrodelineation were carried out with BSS on a blunt cannula. The lens was removed in a stop and chop  technique and the remaining cortical material was removed with the irrigation-aspiration handpiece. The capsular bag was inflated with viscoelastic and the Technis [id number]  lens was placed in the capsular bag without complication. The remaining viscoelastic was removed from the eye with the irrigation-aspiration handpiece. The wounds were hydrated. The anterior chamber was flushed with BSS and the eye was inflated to physiologic pressure. 0.192mof Vigamox was placed in the anterior chamber. The wounds were found to be water tight. The eye was dressed with Combigan. The patient was given protective glasses to wear throughout the day and a shield with which to sleep tonight. The patient was also given drops with which to begin a drop regimen today and  will follow-up with me in one day. ?Implant Name Type Inv. Item Serial No. Manufacturer Lot No. LRB No. Used Action  ?LENS IOL TECNIS EYHANCE 25.0 - [id number] Lens LENS IOL TECNIS EYHANCE 25.0 [id number]  Right 1 Implanted  ? ?Procedure(s): ?CATARACT EXTRACTION PHACO AND INTRAOCULAR LENS PLACEMENT (IOC) RIGHT 14.23 01:08.3 (Right) ? ?Electronically signed: WiBirder Robson/02/2022 12:04 PM ? ?

## 2022-02-25 NOTE — Anesthesia Preprocedure Evaluation (Signed)
Anesthesia Evaluation  ?Patient identified by MRN, date of birth, ID band ?Patient awake ? ? ? ?Reviewed: ?Allergy & Precautions, NPO status  ? ?History of Anesthesia Complications ?Negative for: history of anesthetic complications ? ?Airway ?Mallampati: II ? ? ?Neck ROM: Full ? ? ? Dental ? ? ?Missing molars:   ?Pulmonary ?COPD, former smoker,  ?  ?Pulmonary exam normal ?breath sounds clear to auscultation ? ? ? ? ? ? Cardiovascular ?hypertension,  ?Rhythm:Regular Rate:Normal ? ?Hx DVT and PE postop; last dose of Eliquis 02/12/22 ?  ?Neuro/Psych ?  ? GI/Hepatic ?Crohn's ?  ?Endo/Other  ? ? Renal/GU ?Renal disease (stage III CKD; nephrolithiasis)  ? ?  ?Musculoskeletal ? ?(+) Arthritis ,  ? Abdominal ?  ?Peds ? Hematology ?  ?Anesthesia Other Findings ? ? Reproductive/Obstetrics ? ?  ? ? ? ? ? ? ? ? ? ? ? ? ? ?  ?  ? ? ? ? ? ? ? ? ?Anesthesia Physical ? ?Anesthesia Plan ? ?ASA: 2 ? ?Anesthesia Plan: MAC  ? ?Post-op Pain Management:   ? ?Induction: Intravenous ? ?PONV Risk Score and Plan: 2 and TIVA, Treatment may vary due to age or medical condition and Midazolam ? ?Airway Management Planned: Natural Airway ? ?Additional Equipment:  ? ?Intra-op Plan:  ? ?Post-operative Plan:  ? ?Informed Consent: I have reviewed the patients History and Physical, chart, labs and discussed the procedure including the risks, benefits and alternatives for the proposed anesthesia with the patient or authorized representative who has indicated his/her understanding and acceptance.  ? ? ? ? ? ?Plan Discussed with: CRNA ? ?Anesthesia Plan Comments: (LMA/GETA backup discussed.  Patient consented for risks of anesthesia including but not limited to:  ?- adverse reactions to medications ?- damage to eyes, teeth, lips or other oral mucosa ?- nerve damage due to positioning  ?- sore throat or hoarseness ?- damage to heart, brain, nerves, lungs, other parts of body or loss of life ? ?Informed patient about role  of CRNA in peri- and intra-operative care.  Patient voiced understanding.)  ? ? ? ? ? ? ?Anesthesia Quick Evaluation ? ?

## 2022-02-25 NOTE — Anesthesia Procedure Notes (Signed)
Procedure Name: [place] ?Date/Time: 02/25/2022 11:48 AM ?Performed by: Jeannene Patella, CRNA ?Pre-anesthesia Checklist: Patient identified, Emergency Drugs available, Suction available, Timeout performed and Patient being monitored ?Patient Re-evaluated:Patient Re-evaluated prior to induction ?Oxygen Delivery Method: Nasal cannula ?Placement Confirmation: positive ETCO2 ? ? ? ? ?

## 2022-02-26 ENCOUNTER — Encounter: Payer: Self-pay | Admitting: Ophthalmology

## 2022-03-21 DIAGNOSIS — Z961 Presence of intraocular lens: Secondary | ICD-10-CM | POA: Diagnosis not present

## 2022-03-27 DIAGNOSIS — K50819 Crohn's disease of both small and large intestine with unspecified complications: Secondary | ICD-10-CM | POA: Diagnosis not present

## 2022-04-30 ENCOUNTER — Ambulatory Visit
Admission: RE | Admit: 2022-04-30 | Discharge: 2022-04-30 | Disposition: A | Discharge status: Home / Self Care | Payer: PPO | Source: Ambulatory Visit | Attending: Internal Medicine | Admitting: Internal Medicine

## 2022-04-30 DIAGNOSIS — Z1231 Encounter for screening mammogram for malignant neoplasm of breast: Secondary | ICD-10-CM | POA: Diagnosis not present

## 2022-05-13 DIAGNOSIS — K5000 Crohn's disease of small intestine without complications: Secondary | ICD-10-CM | POA: Diagnosis not present

## 2022-05-22 DIAGNOSIS — K5000 Crohn's disease of small intestine without complications: Secondary | ICD-10-CM | POA: Diagnosis not present

## 2022-05-22 DIAGNOSIS — K50819 Crohn's disease of both small and large intestine with unspecified complications: Secondary | ICD-10-CM | POA: Diagnosis not present

## 2022-06-03 DIAGNOSIS — I10 Essential (primary) hypertension: Secondary | ICD-10-CM | POA: Diagnosis not present

## 2022-06-03 DIAGNOSIS — I2699 Other pulmonary embolism without acute cor pulmonale: Secondary | ICD-10-CM | POA: Diagnosis not present

## 2022-06-03 DIAGNOSIS — N1831 Chronic kidney disease, stage 3a: Secondary | ICD-10-CM | POA: Diagnosis not present

## 2022-06-03 DIAGNOSIS — K50819 Crohn's disease of both small and large intestine with unspecified complications: Secondary | ICD-10-CM | POA: Diagnosis not present

## 2022-06-03 DIAGNOSIS — J449 Chronic obstructive pulmonary disease, unspecified: Secondary | ICD-10-CM | POA: Diagnosis not present

## 2022-07-01 ENCOUNTER — Other Ambulatory Visit: Payer: Self-pay | Admitting: *Deleted

## 2022-07-01 NOTE — Patient Outreach (Signed)
  Care Coordination   Initial Visit Note   07/01/2022 Name: RAVLEEN RIES MRN: [id number] DOB: 07/05/1951  STARKEISHA VANWINKLE is a 71 y.o. year old female who sees Kirk Ruths, MD for primary care. I spoke with  Tilman Neat by phone today  RN discussed services available from  [facility], RN, [place] and Pharmacist. Patient declined services.   SDOH assessments and interventions completed:  No     Care Coordination Interventions Activated:  Yes  Care Coordination Interventions:  No, not indicated   Follow up plan: No further intervention required.   Encounter Outcome:  Pt. [place] Care Management [phone]

## 2022-07-10 ENCOUNTER — Ambulatory Visit: Payer: PPO | Admitting: Dermatology

## 2022-07-10 DIAGNOSIS — L304 Erythema intertrigo: Secondary | ICD-10-CM | POA: Diagnosis not present

## 2022-07-10 DIAGNOSIS — D1800 Hemangioma unspecified site: Secondary | ICD-10-CM

## 2022-07-10 DIAGNOSIS — D229 Melanocytic nevi, unspecified: Secondary | ICD-10-CM | POA: Diagnosis not present

## 2022-07-10 DIAGNOSIS — L814 Other melanin hyperpigmentation: Secondary | ICD-10-CM

## 2022-07-10 DIAGNOSIS — L821 Other seborrheic keratosis: Secondary | ICD-10-CM | POA: Diagnosis not present

## 2022-07-10 DIAGNOSIS — K50919 Crohn's disease, unspecified, with unspecified complications: Secondary | ICD-10-CM

## 2022-07-10 DIAGNOSIS — Z1283 Encounter for screening for malignant neoplasm of skin: Secondary | ICD-10-CM | POA: Diagnosis not present

## 2022-07-10 DIAGNOSIS — L578 Other skin changes due to chronic exposure to nonionizing radiation: Secondary | ICD-10-CM | POA: Diagnosis not present

## 2022-07-10 DIAGNOSIS — D849 Immunodeficiency, unspecified: Secondary | ICD-10-CM

## 2022-07-10 NOTE — Progress Notes (Signed)
   Follow-Up Visit   Subjective  Bridget Huber is a 71 y.o. female who presents for the following: Annual Exam (Patient currently on Remicade for crohn's disease which may increase her risk of skin cancers. She is here today for her annual screening.).   The patient presents for Total-Body Skin Exam (TBSE) for skin cancer screening and mole check.  The patient has spots, moles and lesions to be evaluated, some may be new or changing.   The following portions of the chart were reviewed this encounter and updated as appropriate:   Tobacco  Allergies  Meds  Problems  Med Hx  Surg Hx  Fam Hx      Review of Systems:  No other skin or systemic complaints except as noted in HPI or Assessment and Plan.  Objective  Well appearing patient in no apparent distress; mood and affect are within normal limits.  A full examination was performed including scalp, head, eyes, ears, nose, lips, neck, chest, axillae, abdomen, back, buttocks, bilateral upper extremities, bilateral lower extremities, hands, feet, fingers, toes, fingernails, and toenails. All findings within normal limits unless otherwise noted below.  Inframammary Mild erythema    Assessment & Plan  Erythema intertrigo Inframammary  Intertrigo is a chronic recurrent rash that occurs in skin fold areas that may be associated with friction; heat; moisture; yeast; fungus; and bacteria.  It is exacerbated by increased movement / activity; sweating; and higher atmospheric temperature.  Continue corn starch daily for prevention.  Immunosuppression ([place]) GI tract  Secondary to Remicade infusion for Crohn's.  Recommend annual skin cancer screenings.   Lentigines - Scattered tan macules - Due to sun exposure - Benign-appearing, observe - Recommend daily broad spectrum sunscreen SPF 30+ to sun-exposed areas, reapply every 2 hours as needed. - Call for any changes  Seborrheic Keratoses - Stuck-on, waxy, tan-brown papules  and/or plaques  - Benign-appearing - Discussed benign etiology and prognosis. - Observe - Call for any changes  Melanocytic Nevi - Tan-brown and/or pink-flesh-colored symmetric macules and papules - Benign appearing on exam today - Observation - Call clinic for new or changing moles - Recommend daily use of broad spectrum spf 30+ sunscreen to sun-exposed areas.   Hemangiomas - Red papules - Discussed benign nature - Observe - Call for any changes  Actinic Damage - Chronic condition, secondary to cumulative UV/sun exposure - diffuse scaly erythematous macules with underlying dyspigmentation - Recommend daily broad spectrum sunscreen SPF 30+ to sun-exposed areas, reapply every 2 hours as needed.  - Staying in the shade or wearing long sleeves, sun glasses (UVA+UVB protection) and wide brim hats (4-inch brim around the entire circumference of the hat) are also recommended for sun protection.  - Call for new or changing lesions.  Skin cancer screening performed today.  Return in about 1 year (around 07/11/2023) for TBSE.  Luther Redo, CMA, am acting as scribe for Forest Gleason, MD .  Documentation: I have reviewed the above documentation for accuracy and completeness, and I agree with the above.  Forest Gleason, MD

## 2022-07-10 NOTE — Patient Instructions (Signed)
Due to recent changes in healthcare laws, you may see results of your pathology and/or laboratory studies on MyChart before the doctors have had a chance to review them. We understand that in some cases there may be results that are confusing or concerning to you. Please understand that not all results are received at the same time and often the doctors may need to interpret multiple results in order to provide you with the best plan of care or course of treatment. Therefore, we ask that you please give [place] 2 business days to thoroughly review all your results before contacting the office for clarification. Should we see a critical lab result, you will be contacted sooner.   If You Need Anything After Your Visit  If you have any questions or concerns for your doctor, please call our main line at [phone] and press option 4 to reach your doctor's medical assistant. If no one answers, please leave a voicemail as directed and we will return your call as soon as possible. Messages left after 4 pm will be answered the following business day.   You may also send [place] a message via [place]. We typically respond to MyChart messages within 1-2 business days.  For prescription refills, please ask your pharmacy to contact our office. Our fax number is [phone].  If you have an urgent issue when the clinic is closed that cannot wait until the next business day, you can page your doctor at the number below.    Please note that while we do our best to be available for urgent issues outside of office hours, we are not available 24/7.   If you have an urgent issue and are unable to reach [place], you may choose to seek medical care at your doctor's office, retail clinic, urgent care center, or emergency room.  If you have a medical emergency, please immediately call 911 or go to the emergency department.  Pager Numbers  - Dr. Nehemiah Massed: [phone]  - Dr. Laurence Ferrari: [phone]  - Dr. Nicole Kindred:  [phone]  In the event of inclement weather, please call our main line at [phone] for an update on the status of any delays or closures.  Dermatology Medication Tips: Please keep the boxes that topical medications come in in order to help keep track of the instructions about where and how to use these. Pharmacies typically print the medication instructions only on the boxes and not directly on the medication tubes.   If your medication is too expensive, please contact our office at [phone] option 4 or send [place] a message through [place].   We are unable to tell what your co-pay for medications will be in advance as this is different depending on your insurance coverage. However, we may be able to find a substitute medication at lower cost or fill out paperwork to get insurance to cover a needed medication.   If a prior authorization is required to get your medication covered by your insurance company, please allow [place] 1-2 business days to complete this process.  Drug prices often vary depending on where the prescription is filled and some pharmacies may offer cheaper prices.  The website www.goodrx.com contains coupons for medications through different pharmacies. The prices here do not account for what the cost may be with help from insurance (it may be cheaper with your insurance), but the website can give you the price if you did not use any insurance.  - You can print the associated coupon and take it with  your prescription to the pharmacy.  - You may also stop by our office during regular business hours and pick up a GoodRx coupon card.  - If you need your prescription sent electronically to a different pharmacy, notify our office through [facility] or by phone at [phone] option 4.     Si Usted Necesita Algo Despus de Su Visita  Tambin puede enviarnos un mensaje a travs de Pharmacist, community. Por lo general respondemos a los mensajes de MyChart en el transcurso de 1 a 2  das hbiles.  Para renovar recetas, por favor pida a su farmacia que se ponga en contacto con nuestra oficina. Harland Dingwall de fax es [place] [phone].  Si tiene un asunto urgente cuando la clnica est cerrada y que no puede esperar hasta el siguiente da hbil, puede llamar/localizar a su doctor(a) al nmero que aparece a continuacin.   Por favor, tenga en cuenta que aunque hacemos todo lo posible para estar disponibles para asuntos urgentes fuera del horario de [place], no estamos disponibles las 24 horas del da, los 7 das de la [place].   Si tiene un problema urgente y no puede comunicarse con nosotros, puede optar por buscar atencin mdica  en el consultorio de su doctor(a), en una clnica privada, en un centro de atencin urgente o en una sala de emergencias.  Si tiene Engineering geologist, por favor llame inmediatamente al 911 o vaya a la sala de emergencias.  Nmeros de bper  - Dr. Nehemiah Massed: [phone]  - Dra. Moye: [phone]  - Dra. Nicole Kindred: [phone]  En caso de inclemencias del [place], por favor llame a Johnsie Kindred principal al [phone] para una actualizacin sobre el [place] de cualquier retraso o cierre.  Consejos para la medicacin en dermatologa: Por favor, guarde las cajas en las que vienen los medicamentos de uso tpico para ayudarle a seguir las instrucciones sobre dnde y cmo usarlos. Las farmacias generalmente imprimen las instrucciones del medicamento slo en las cajas y no directamente en los tubos del [place].   Si su medicamento es muy caro, por favor, pngase en contacto con Zigmund Daniel llamando al [phone] y presione la opcin 4 o envenos un mensaje a travs de Pharmacist, community.   No podemos decirle cul ser su copago por los medicamentos por adelantado ya que esto es diferente dependiendo de la cobertura de su seguro. Sin embargo, es posible que podamos encontrar un medicamento sustituto a Electrical engineer un formulario para que el  seguro cubra el medicamento que se considera necesario.   Si se requiere una autorizacin previa para que su compaa de seguros [place] su medicamento, por favor permtanos de 1 a 2 das hbiles para completar este proceso.  Los precios de los medicamentos varan con frecuencia dependiendo del Environmental consultant de dnde se surte la receta y alguna farmacias pueden ofrecer precios ms baratos.  El sitio web www.goodrx.com tiene cupones para medicamentos de Airline pilot. Los precios aqu no tienen en cuenta lo que podra costar con la ayuda del seguro (puede ser ms barato con su seguro), pero el sitio web puede darle el precio si no utiliz Research scientist (physical sciences).  - Puede imprimir el cupn correspondiente y llevarlo con su receta a la farmacia.  - Tambin puede pasar por nuestra oficina durante el horario de atencin regular y Charity fundraiser una tarjeta de cupones de GoodRx.  - Si necesita que su receta se enve electrnicamente a Chiropodist, informe a nuestra oficina a travs [place]  o por telfono llamando al [phone] y presione la opcin 4.

## 2022-07-14 DIAGNOSIS — I2699 Other pulmonary embolism without acute cor pulmonale: Secondary | ICD-10-CM | POA: Diagnosis not present

## 2022-07-14 DIAGNOSIS — Z86711 Personal history of pulmonary embolism: Secondary | ICD-10-CM | POA: Diagnosis not present

## 2022-07-17 DIAGNOSIS — K50819 Crohn's disease of both small and large intestine with unspecified complications: Secondary | ICD-10-CM | POA: Diagnosis not present

## 2022-07-17 DIAGNOSIS — I2699 Other pulmonary embolism without acute cor pulmonale: Secondary | ICD-10-CM | POA: Diagnosis not present

## 2022-07-17 DIAGNOSIS — Z86711 Personal history of pulmonary embolism: Secondary | ICD-10-CM | POA: Diagnosis not present

## 2022-07-20 ENCOUNTER — Encounter: Payer: Self-pay | Admitting: Dermatology

## 2022-07-24 DIAGNOSIS — Z86711 Personal history of pulmonary embolism: Secondary | ICD-10-CM | POA: Diagnosis not present

## 2022-07-24 DIAGNOSIS — I2699 Other pulmonary embolism without acute cor pulmonale: Secondary | ICD-10-CM | POA: Diagnosis not present

## 2022-08-07 DIAGNOSIS — I2699 Other pulmonary embolism without acute cor pulmonale: Secondary | ICD-10-CM | POA: Diagnosis not present

## 2022-08-07 DIAGNOSIS — Z86711 Personal history of pulmonary embolism: Secondary | ICD-10-CM | POA: Diagnosis not present

## 2022-08-21 DIAGNOSIS — Z86711 Personal history of pulmonary embolism: Secondary | ICD-10-CM | POA: Diagnosis not present

## 2022-09-04 DIAGNOSIS — Z86711 Personal history of pulmonary embolism: Secondary | ICD-10-CM | POA: Diagnosis not present

## 2022-09-18 DIAGNOSIS — H43813 Vitreous degeneration, bilateral: Secondary | ICD-10-CM | POA: Diagnosis not present

## 2022-09-18 DIAGNOSIS — Z86711 Personal history of pulmonary embolism: Secondary | ICD-10-CM | POA: Diagnosis not present

## 2022-09-24 DIAGNOSIS — Z86711 Personal history of pulmonary embolism: Secondary | ICD-10-CM | POA: Diagnosis not present

## 2022-09-24 DIAGNOSIS — Z7901 Long term (current) use of anticoagulants: Secondary | ICD-10-CM | POA: Diagnosis not present

## 2022-09-25 ENCOUNTER — Ambulatory Visit (LOCAL_COMMUNITY_HEALTH_CENTER): Payer: PPO

## 2022-09-25 DIAGNOSIS — Z719 Counseling, unspecified: Secondary | ICD-10-CM

## 2022-09-25 DIAGNOSIS — Z23 Encounter for immunization: Secondary | ICD-10-CM

## 2022-09-25 NOTE — Progress Notes (Signed)
  Are you feeling sick today? No   Have you ever received a dose of COVID-19 Vaccine? [employer], [place], [place], [place], Other) Yes  If yes, which vaccine and how many doses?   5   Did you bring the vaccination record card or other documentation?  No   Do you have a health condition or are undergoing treatment that makes you moderately or severely immunocompromised? This would include, but not be limited to: cancer, HIV, organ transplant, immunosuppressive therapy/high-dose corticosteroids, or moderate/severe primary immunodeficiency.  No  Have you received COVID-19 vaccine before or during hematopoietic cell transplant (HCT) or CAR-T-cell therapies? No  Have you ever had an allergic reaction to: (This would include a severe allergic reaction or a reaction that caused hives, swelling, or respiratory distress, including wheezing.) A component of a COVID-19 vaccine or a previous dose of COVID-19 vaccine? No   Have you ever had an allergic reaction to another vaccine (other thanCOVID-19 vaccine) or an injectable medication? (This would include a severe allergic reaction or a reaction that caused hives, swelling, or respiratory distress, including wheezing.)   No    Do you have a history of any of the following:  Myocarditis or Pericarditis No  Dermal fillers:  No  Multisystem Inflammatory Syndrome (MIS-C or MIS-A)? No  COVID-19 disease within the past 3 months? No  Vaccinated with monkeypox vaccine in the last 4 weeks? No   In nurse clinic today for Covid vaccine and flu vaccine. Tolerated well. Documented in [place]. Copy given to pt. Advised pt High dose flu shot given on same day as Covid vaccine has shown increase risk in stroke. Pt requested to receive the Fluzone instead of high dose, she did not want to wait 2weeks.

## 2022-10-07 DIAGNOSIS — Z86711 Personal history of pulmonary embolism: Secondary | ICD-10-CM | POA: Diagnosis not present

## 2022-10-23 DIAGNOSIS — Z86711 Personal history of pulmonary embolism: Secondary | ICD-10-CM | POA: Diagnosis not present

## 2022-10-27 DIAGNOSIS — Z86711 Personal history of pulmonary embolism: Secondary | ICD-10-CM | POA: Diagnosis not present

## 2022-11-06 DIAGNOSIS — Z131 Encounter for screening for diabetes mellitus: Secondary | ICD-10-CM | POA: Diagnosis not present

## 2022-11-06 DIAGNOSIS — K50819 Crohn's disease of both small and large intestine with unspecified complications: Secondary | ICD-10-CM | POA: Diagnosis not present

## 2022-11-13 DIAGNOSIS — K5000 Crohn's disease of small intestine without complications: Secondary | ICD-10-CM | POA: Diagnosis not present

## 2022-11-27 DIAGNOSIS — K5000 Crohn's disease of small intestine without complications: Secondary | ICD-10-CM | POA: Diagnosis not present

## 2022-11-27 DIAGNOSIS — Z86711 Personal history of pulmonary embolism: Secondary | ICD-10-CM | POA: Diagnosis not present

## 2022-12-04 DIAGNOSIS — R350 Frequency of micturition: Secondary | ICD-10-CM | POA: Diagnosis not present

## 2022-12-04 DIAGNOSIS — I10 Essential (primary) hypertension: Secondary | ICD-10-CM | POA: Diagnosis not present

## 2022-12-04 DIAGNOSIS — K50819 Crohn's disease of both small and large intestine with unspecified complications: Secondary | ICD-10-CM | POA: Diagnosis not present

## 2022-12-04 DIAGNOSIS — J449 Chronic obstructive pulmonary disease, unspecified: Secondary | ICD-10-CM | POA: Diagnosis not present

## 2022-12-04 DIAGNOSIS — N184 Chronic kidney disease, stage 4 (severe): Secondary | ICD-10-CM | POA: Diagnosis not present

## 2022-12-04 DIAGNOSIS — D849 Immunodeficiency, unspecified: Secondary | ICD-10-CM | POA: Diagnosis not present

## 2022-12-04 DIAGNOSIS — Z0000 Encounter for general adult medical examination without abnormal findings: Secondary | ICD-10-CM | POA: Diagnosis not present

## 2022-12-04 DIAGNOSIS — I2782 Chronic pulmonary embolism: Secondary | ICD-10-CM | POA: Diagnosis not present

## 2022-12-04 DIAGNOSIS — Z789 Other specified health status: Secondary | ICD-10-CM | POA: Diagnosis not present

## 2022-12-15 DIAGNOSIS — N184 Chronic kidney disease, stage 4 (severe): Secondary | ICD-10-CM | POA: Diagnosis not present

## 2022-12-15 DIAGNOSIS — I10 Essential (primary) hypertension: Secondary | ICD-10-CM | POA: Diagnosis not present

## 2022-12-15 DIAGNOSIS — Z86711 Personal history of pulmonary embolism: Secondary | ICD-10-CM | POA: Diagnosis not present

## 2022-12-26 ENCOUNTER — Other Ambulatory Visit: Payer: Self-pay | Admitting: Internal Medicine

## 2022-12-26 DIAGNOSIS — Z1231 Encounter for screening mammogram for malignant neoplasm of breast: Secondary | ICD-10-CM

## 2023-01-01 DIAGNOSIS — Z86711 Personal history of pulmonary embolism: Secondary | ICD-10-CM | POA: Diagnosis not present

## 2023-01-01 DIAGNOSIS — K50819 Crohn's disease of both small and large intestine with unspecified complications: Secondary | ICD-10-CM | POA: Diagnosis not present

## 2023-01-01 DIAGNOSIS — Z7901 Long term (current) use of anticoagulants: Secondary | ICD-10-CM | POA: Diagnosis not present

## 2023-01-20 DIAGNOSIS — D631 Anemia in chronic kidney disease: Secondary | ICD-10-CM | POA: Diagnosis not present

## 2023-01-20 DIAGNOSIS — I10 Essential (primary) hypertension: Secondary | ICD-10-CM | POA: Diagnosis not present

## 2023-01-20 DIAGNOSIS — R8290 Unspecified abnormal findings in urine: Secondary | ICD-10-CM | POA: Diagnosis not present

## 2023-01-20 DIAGNOSIS — K50918 Crohn's disease, unspecified, with other complication: Secondary | ICD-10-CM | POA: Diagnosis not present

## 2023-01-20 DIAGNOSIS — J449 Chronic obstructive pulmonary disease, unspecified: Secondary | ICD-10-CM | POA: Diagnosis not present

## 2023-01-20 DIAGNOSIS — Z9049 Acquired absence of other specified parts of digestive tract: Secondary | ICD-10-CM | POA: Diagnosis not present

## 2023-01-20 DIAGNOSIS — N200 Calculus of kidney: Secondary | ICD-10-CM | POA: Diagnosis not present

## 2023-01-20 DIAGNOSIS — N184 Chronic kidney disease, stage 4 (severe): Secondary | ICD-10-CM | POA: Diagnosis not present

## 2023-01-20 DIAGNOSIS — Z9889 Other specified postprocedural states: Secondary | ICD-10-CM | POA: Diagnosis not present

## 2023-01-20 DIAGNOSIS — E785 Hyperlipidemia, unspecified: Secondary | ICD-10-CM | POA: Diagnosis not present

## 2023-01-22 ENCOUNTER — Other Ambulatory Visit: Payer: Self-pay | Admitting: Nephrology

## 2023-01-22 DIAGNOSIS — D631 Anemia in chronic kidney disease: Secondary | ICD-10-CM

## 2023-01-22 DIAGNOSIS — E785 Hyperlipidemia, unspecified: Secondary | ICD-10-CM

## 2023-01-22 DIAGNOSIS — R8290 Unspecified abnormal findings in urine: Secondary | ICD-10-CM

## 2023-01-22 DIAGNOSIS — N184 Chronic kidney disease, stage 4 (severe): Secondary | ICD-10-CM

## 2023-01-29 DIAGNOSIS — Z86711 Personal history of pulmonary embolism: Secondary | ICD-10-CM | POA: Diagnosis not present

## 2023-01-29 DIAGNOSIS — Z7901 Long term (current) use of anticoagulants: Secondary | ICD-10-CM | POA: Diagnosis not present

## 2023-02-02 ENCOUNTER — Ambulatory Visit
Admission: RE | Admit: 2023-02-02 | Discharge: 2023-02-02 | Disposition: A | Discharge status: Home / Self Care | Payer: PPO | Source: Ambulatory Visit | Attending: Nephrology | Admitting: Nephrology

## 2023-02-02 DIAGNOSIS — E785 Hyperlipidemia, unspecified: Secondary | ICD-10-CM | POA: Diagnosis not present

## 2023-02-02 DIAGNOSIS — D631 Anemia in chronic kidney disease: Secondary | ICD-10-CM | POA: Insufficient documentation

## 2023-02-02 DIAGNOSIS — N189 Chronic kidney disease, unspecified: Secondary | ICD-10-CM | POA: Insufficient documentation

## 2023-02-02 DIAGNOSIS — N184 Chronic kidney disease, stage 4 (severe): Secondary | ICD-10-CM | POA: Insufficient documentation

## 2023-02-02 DIAGNOSIS — R8290 Unspecified abnormal findings in urine: Secondary | ICD-10-CM | POA: Diagnosis not present

## 2023-02-10 DIAGNOSIS — D631 Anemia in chronic kidney disease: Secondary | ICD-10-CM | POA: Diagnosis not present

## 2023-02-10 DIAGNOSIS — Z9889 Other specified postprocedural states: Secondary | ICD-10-CM | POA: Diagnosis not present

## 2023-02-10 DIAGNOSIS — I10 Essential (primary) hypertension: Secondary | ICD-10-CM | POA: Diagnosis not present

## 2023-02-10 DIAGNOSIS — N184 Chronic kidney disease, stage 4 (severe): Secondary | ICD-10-CM | POA: Diagnosis not present

## 2023-02-10 DIAGNOSIS — K50918 Crohn's disease, unspecified, with other complication: Secondary | ICD-10-CM | POA: Diagnosis not present

## 2023-02-10 DIAGNOSIS — J449 Chronic obstructive pulmonary disease, unspecified: Secondary | ICD-10-CM | POA: Diagnosis not present

## 2023-02-10 DIAGNOSIS — Z9049 Acquired absence of other specified parts of digestive tract: Secondary | ICD-10-CM | POA: Diagnosis not present

## 2023-02-10 DIAGNOSIS — E785 Hyperlipidemia, unspecified: Secondary | ICD-10-CM | POA: Diagnosis not present

## 2023-02-10 DIAGNOSIS — N200 Calculus of kidney: Secondary | ICD-10-CM | POA: Diagnosis not present

## 2023-02-10 DIAGNOSIS — R8290 Unspecified abnormal findings in urine: Secondary | ICD-10-CM | POA: Diagnosis not present

## 2023-02-17 DIAGNOSIS — I10 Essential (primary) hypertension: Secondary | ICD-10-CM | POA: Diagnosis not present

## 2023-02-17 DIAGNOSIS — N200 Calculus of kidney: Secondary | ICD-10-CM | POA: Diagnosis not present

## 2023-02-17 DIAGNOSIS — K50918 Crohn's disease, unspecified, with other complication: Secondary | ICD-10-CM | POA: Diagnosis not present

## 2023-02-17 DIAGNOSIS — R8290 Unspecified abnormal findings in urine: Secondary | ICD-10-CM | POA: Diagnosis not present

## 2023-02-17 DIAGNOSIS — J449 Chronic obstructive pulmonary disease, unspecified: Secondary | ICD-10-CM | POA: Diagnosis not present

## 2023-02-17 DIAGNOSIS — Z9049 Acquired absence of other specified parts of digestive tract: Secondary | ICD-10-CM | POA: Diagnosis not present

## 2023-02-17 DIAGNOSIS — N184 Chronic kidney disease, stage 4 (severe): Secondary | ICD-10-CM | POA: Diagnosis not present

## 2023-02-17 DIAGNOSIS — E785 Hyperlipidemia, unspecified: Secondary | ICD-10-CM | POA: Diagnosis not present

## 2023-02-17 DIAGNOSIS — Z9889 Other specified postprocedural states: Secondary | ICD-10-CM | POA: Diagnosis not present

## 2023-02-17 DIAGNOSIS — D631 Anemia in chronic kidney disease: Secondary | ICD-10-CM | POA: Diagnosis not present

## 2023-02-26 DIAGNOSIS — Z7901 Long term (current) use of anticoagulants: Secondary | ICD-10-CM | POA: Diagnosis not present

## 2023-02-26 DIAGNOSIS — Z86711 Personal history of pulmonary embolism: Secondary | ICD-10-CM | POA: Diagnosis not present

## 2023-02-26 DIAGNOSIS — K5000 Crohn's disease of small intestine without complications: Secondary | ICD-10-CM | POA: Diagnosis not present

## 2023-03-26 DIAGNOSIS — Z7901 Long term (current) use of anticoagulants: Secondary | ICD-10-CM | POA: Diagnosis not present

## 2023-03-26 DIAGNOSIS — Z86711 Personal history of pulmonary embolism: Secondary | ICD-10-CM | POA: Diagnosis not present

## 2023-04-23 DIAGNOSIS — Z86711 Personal history of pulmonary embolism: Secondary | ICD-10-CM | POA: Diagnosis not present

## 2023-04-23 DIAGNOSIS — K5000 Crohn's disease of small intestine without complications: Secondary | ICD-10-CM | POA: Diagnosis not present

## 2023-04-23 DIAGNOSIS — Z7901 Long term (current) use of anticoagulants: Secondary | ICD-10-CM | POA: Diagnosis not present

## 2023-05-07 ENCOUNTER — Ambulatory Visit
Admission: RE | Admit: 2023-05-07 | Discharge: 2023-05-07 | Disposition: A | Discharge status: Home / Self Care | Payer: PPO | Source: Ambulatory Visit | Attending: Internal Medicine | Admitting: Internal Medicine

## 2023-05-07 DIAGNOSIS — Z1231 Encounter for screening mammogram for malignant neoplasm of breast: Secondary | ICD-10-CM | POA: Diagnosis not present

## 2023-05-13 DIAGNOSIS — E785 Hyperlipidemia, unspecified: Secondary | ICD-10-CM | POA: Diagnosis not present

## 2023-05-13 DIAGNOSIS — Z9049 Acquired absence of other specified parts of digestive tract: Secondary | ICD-10-CM | POA: Diagnosis not present

## 2023-05-13 DIAGNOSIS — J449 Chronic obstructive pulmonary disease, unspecified: Secondary | ICD-10-CM | POA: Diagnosis not present

## 2023-05-13 DIAGNOSIS — R8290 Unspecified abnormal findings in urine: Secondary | ICD-10-CM | POA: Diagnosis not present

## 2023-05-13 DIAGNOSIS — I10 Essential (primary) hypertension: Secondary | ICD-10-CM | POA: Diagnosis not present

## 2023-05-13 DIAGNOSIS — D631 Anemia in chronic kidney disease: Secondary | ICD-10-CM | POA: Diagnosis not present

## 2023-05-13 DIAGNOSIS — N200 Calculus of kidney: Secondary | ICD-10-CM | POA: Diagnosis not present

## 2023-05-13 DIAGNOSIS — N184 Chronic kidney disease, stage 4 (severe): Secondary | ICD-10-CM | POA: Diagnosis not present

## 2023-05-13 DIAGNOSIS — K50918 Crohn's disease, unspecified, with other complication: Secondary | ICD-10-CM | POA: Diagnosis not present

## 2023-05-13 DIAGNOSIS — Z9889 Other specified postprocedural states: Secondary | ICD-10-CM | POA: Diagnosis not present

## 2023-05-14 DIAGNOSIS — J449 Chronic obstructive pulmonary disease, unspecified: Secondary | ICD-10-CM | POA: Diagnosis not present

## 2023-05-14 DIAGNOSIS — N200 Calculus of kidney: Secondary | ICD-10-CM | POA: Diagnosis not present

## 2023-05-14 DIAGNOSIS — Z9049 Acquired absence of other specified parts of digestive tract: Secondary | ICD-10-CM | POA: Diagnosis not present

## 2023-05-14 DIAGNOSIS — Z9889 Other specified postprocedural states: Secondary | ICD-10-CM | POA: Diagnosis not present

## 2023-05-14 DIAGNOSIS — I10 Essential (primary) hypertension: Secondary | ICD-10-CM | POA: Diagnosis not present

## 2023-05-14 DIAGNOSIS — N184 Chronic kidney disease, stage 4 (severe): Secondary | ICD-10-CM | POA: Diagnosis not present

## 2023-05-14 DIAGNOSIS — K50918 Crohn's disease, unspecified, with other complication: Secondary | ICD-10-CM | POA: Diagnosis not present

## 2023-05-14 DIAGNOSIS — D631 Anemia in chronic kidney disease: Secondary | ICD-10-CM | POA: Diagnosis not present

## 2023-05-14 DIAGNOSIS — R8290 Unspecified abnormal findings in urine: Secondary | ICD-10-CM | POA: Diagnosis not present

## 2023-05-14 DIAGNOSIS — E785 Hyperlipidemia, unspecified: Secondary | ICD-10-CM | POA: Diagnosis not present

## 2023-05-20 DIAGNOSIS — I10 Essential (primary) hypertension: Secondary | ICD-10-CM | POA: Diagnosis not present

## 2023-05-20 DIAGNOSIS — Z7901 Long term (current) use of anticoagulants: Secondary | ICD-10-CM | POA: Diagnosis not present

## 2023-05-20 DIAGNOSIS — Z86711 Personal history of pulmonary embolism: Secondary | ICD-10-CM | POA: Diagnosis not present

## 2023-05-20 DIAGNOSIS — Z9889 Other specified postprocedural states: Secondary | ICD-10-CM | POA: Diagnosis not present

## 2023-05-20 DIAGNOSIS — N200 Calculus of kidney: Secondary | ICD-10-CM | POA: Diagnosis not present

## 2023-05-20 DIAGNOSIS — N184 Chronic kidney disease, stage 4 (severe): Secondary | ICD-10-CM | POA: Diagnosis not present

## 2023-05-20 DIAGNOSIS — J449 Chronic obstructive pulmonary disease, unspecified: Secondary | ICD-10-CM | POA: Diagnosis not present

## 2023-05-20 DIAGNOSIS — Z9049 Acquired absence of other specified parts of digestive tract: Secondary | ICD-10-CM | POA: Diagnosis not present

## 2023-05-20 DIAGNOSIS — K50918 Crohn's disease, unspecified, with other complication: Secondary | ICD-10-CM | POA: Diagnosis not present

## 2023-05-20 DIAGNOSIS — D631 Anemia in chronic kidney disease: Secondary | ICD-10-CM | POA: Diagnosis not present

## 2023-05-20 DIAGNOSIS — E785 Hyperlipidemia, unspecified: Secondary | ICD-10-CM | POA: Diagnosis not present

## 2023-06-04 DIAGNOSIS — N184 Chronic kidney disease, stage 4 (severe): Secondary | ICD-10-CM | POA: Diagnosis not present

## 2023-06-04 DIAGNOSIS — I10 Essential (primary) hypertension: Secondary | ICD-10-CM | POA: Diagnosis not present

## 2023-06-04 DIAGNOSIS — K50819 Crohn's disease of both small and large intestine with unspecified complications: Secondary | ICD-10-CM | POA: Diagnosis not present

## 2023-06-04 DIAGNOSIS — J449 Chronic obstructive pulmonary disease, unspecified: Secondary | ICD-10-CM | POA: Diagnosis not present

## 2023-06-18 DIAGNOSIS — N184 Chronic kidney disease, stage 4 (severe): Secondary | ICD-10-CM | POA: Diagnosis not present

## 2023-06-18 DIAGNOSIS — Z7901 Long term (current) use of anticoagulants: Secondary | ICD-10-CM | POA: Diagnosis not present

## 2023-06-18 DIAGNOSIS — I10 Essential (primary) hypertension: Secondary | ICD-10-CM | POA: Diagnosis not present

## 2023-06-18 DIAGNOSIS — Z86711 Personal history of pulmonary embolism: Secondary | ICD-10-CM | POA: Diagnosis not present

## 2023-07-15 ENCOUNTER — Encounter: Payer: PPO | Admitting: Dermatology

## 2023-07-16 DIAGNOSIS — Z86711 Personal history of pulmonary embolism: Secondary | ICD-10-CM | POA: Diagnosis not present

## 2023-07-16 DIAGNOSIS — Z7901 Long term (current) use of anticoagulants: Secondary | ICD-10-CM | POA: Diagnosis not present

## 2023-08-13 DIAGNOSIS — Z86711 Personal history of pulmonary embolism: Secondary | ICD-10-CM | POA: Diagnosis not present

## 2023-08-13 DIAGNOSIS — Z7901 Long term (current) use of anticoagulants: Secondary | ICD-10-CM | POA: Diagnosis not present

## 2023-09-10 DIAGNOSIS — Z7901 Long term (current) use of anticoagulants: Secondary | ICD-10-CM | POA: Diagnosis not present

## 2023-09-10 DIAGNOSIS — Z86711 Personal history of pulmonary embolism: Secondary | ICD-10-CM | POA: Diagnosis not present

## 2023-09-15 DIAGNOSIS — N184 Chronic kidney disease, stage 4 (severe): Secondary | ICD-10-CM | POA: Diagnosis not present

## 2023-09-15 DIAGNOSIS — D631 Anemia in chronic kidney disease: Secondary | ICD-10-CM | POA: Diagnosis not present

## 2023-09-15 DIAGNOSIS — Z9049 Acquired absence of other specified parts of digestive tract: Secondary | ICD-10-CM | POA: Diagnosis not present

## 2023-09-15 DIAGNOSIS — I10 Essential (primary) hypertension: Secondary | ICD-10-CM | POA: Diagnosis not present

## 2023-09-15 DIAGNOSIS — Z9889 Other specified postprocedural states: Secondary | ICD-10-CM | POA: Diagnosis not present

## 2023-09-15 DIAGNOSIS — J449 Chronic obstructive pulmonary disease, unspecified: Secondary | ICD-10-CM | POA: Diagnosis not present

## 2023-09-15 DIAGNOSIS — K50918 Crohn's disease, unspecified, with other complication: Secondary | ICD-10-CM | POA: Diagnosis not present

## 2023-09-15 DIAGNOSIS — E785 Hyperlipidemia, unspecified: Secondary | ICD-10-CM | POA: Diagnosis not present

## 2023-09-15 DIAGNOSIS — N200 Calculus of kidney: Secondary | ICD-10-CM | POA: Diagnosis not present

## 2023-09-21 DIAGNOSIS — Z961 Presence of intraocular lens: Secondary | ICD-10-CM | POA: Diagnosis not present

## 2023-09-21 DIAGNOSIS — H35341 Macular cyst, hole, or pseudohole, right eye: Secondary | ICD-10-CM | POA: Diagnosis not present

## 2023-09-21 DIAGNOSIS — H43813 Vitreous degeneration, bilateral: Secondary | ICD-10-CM | POA: Diagnosis not present

## 2023-09-21 DIAGNOSIS — H35362 Drusen (degenerative) of macula, left eye: Secondary | ICD-10-CM | POA: Diagnosis not present

## 2023-09-22 DIAGNOSIS — N184 Chronic kidney disease, stage 4 (severe): Secondary | ICD-10-CM | POA: Diagnosis not present

## 2023-09-22 DIAGNOSIS — J449 Chronic obstructive pulmonary disease, unspecified: Secondary | ICD-10-CM | POA: Diagnosis not present

## 2023-09-22 DIAGNOSIS — D631 Anemia in chronic kidney disease: Secondary | ICD-10-CM | POA: Diagnosis not present

## 2023-09-22 DIAGNOSIS — Z9889 Other specified postprocedural states: Secondary | ICD-10-CM | POA: Diagnosis not present

## 2023-09-22 DIAGNOSIS — R8290 Unspecified abnormal findings in urine: Secondary | ICD-10-CM | POA: Diagnosis not present

## 2023-09-22 DIAGNOSIS — N200 Calculus of kidney: Secondary | ICD-10-CM | POA: Diagnosis not present

## 2023-09-22 DIAGNOSIS — E785 Hyperlipidemia, unspecified: Secondary | ICD-10-CM | POA: Diagnosis not present

## 2023-09-22 DIAGNOSIS — K50918 Crohn's disease, unspecified, with other complication: Secondary | ICD-10-CM | POA: Diagnosis not present

## 2023-09-22 DIAGNOSIS — I10 Essential (primary) hypertension: Secondary | ICD-10-CM | POA: Diagnosis not present

## 2023-09-29 DIAGNOSIS — K5000 Crohn's disease of small intestine without complications: Secondary | ICD-10-CM | POA: Diagnosis not present

## 2023-09-29 DIAGNOSIS — Z7901 Long term (current) use of anticoagulants: Secondary | ICD-10-CM | POA: Diagnosis not present

## 2023-09-29 DIAGNOSIS — Z86711 Personal history of pulmonary embolism: Secondary | ICD-10-CM | POA: Diagnosis not present

## 2023-10-06 DIAGNOSIS — K5000 Crohn's disease of small intestine without complications: Secondary | ICD-10-CM | POA: Diagnosis not present

## 2023-10-29 DIAGNOSIS — Z86711 Personal history of pulmonary embolism: Secondary | ICD-10-CM | POA: Diagnosis not present

## 2023-10-29 DIAGNOSIS — Z7901 Long term (current) use of anticoagulants: Secondary | ICD-10-CM | POA: Diagnosis not present

## 2023-11-26 DIAGNOSIS — Z86711 Personal history of pulmonary embolism: Secondary | ICD-10-CM | POA: Diagnosis not present

## 2023-11-26 DIAGNOSIS — Z7901 Long term (current) use of anticoagulants: Secondary | ICD-10-CM | POA: Diagnosis not present

## 2023-12-16 ENCOUNTER — Other Ambulatory Visit: Payer: Self-pay | Admitting: Internal Medicine

## 2023-12-16 DIAGNOSIS — Z1231 Encounter for screening mammogram for malignant neoplasm of breast: Secondary | ICD-10-CM

## 2023-12-24 DIAGNOSIS — Z86711 Personal history of pulmonary embolism: Secondary | ICD-10-CM | POA: Diagnosis not present

## 2023-12-24 DIAGNOSIS — Z7901 Long term (current) use of anticoagulants: Secondary | ICD-10-CM | POA: Diagnosis not present

## 2024-01-19 DIAGNOSIS — Z9889 Other specified postprocedural states: Secondary | ICD-10-CM | POA: Diagnosis not present

## 2024-01-19 DIAGNOSIS — N184 Chronic kidney disease, stage 4 (severe): Secondary | ICD-10-CM | POA: Diagnosis not present

## 2024-01-19 DIAGNOSIS — I10 Essential (primary) hypertension: Secondary | ICD-10-CM | POA: Diagnosis not present

## 2024-01-19 DIAGNOSIS — Z7901 Long term (current) use of anticoagulants: Secondary | ICD-10-CM | POA: Diagnosis not present

## 2024-01-19 DIAGNOSIS — N200 Calculus of kidney: Secondary | ICD-10-CM | POA: Diagnosis not present

## 2024-01-19 DIAGNOSIS — J449 Chronic obstructive pulmonary disease, unspecified: Secondary | ICD-10-CM | POA: Diagnosis not present

## 2024-01-19 DIAGNOSIS — K50918 Crohn's disease, unspecified, with other complication: Secondary | ICD-10-CM | POA: Diagnosis not present

## 2024-01-19 DIAGNOSIS — E785 Hyperlipidemia, unspecified: Secondary | ICD-10-CM | POA: Diagnosis not present

## 2024-01-19 DIAGNOSIS — R8290 Unspecified abnormal findings in urine: Secondary | ICD-10-CM | POA: Diagnosis not present

## 2024-01-19 DIAGNOSIS — Z86711 Personal history of pulmonary embolism: Secondary | ICD-10-CM | POA: Diagnosis not present

## 2024-01-19 DIAGNOSIS — D631 Anemia in chronic kidney disease: Secondary | ICD-10-CM | POA: Diagnosis not present

## 2024-01-26 DIAGNOSIS — D631 Anemia in chronic kidney disease: Secondary | ICD-10-CM | POA: Diagnosis not present

## 2024-01-26 DIAGNOSIS — J449 Chronic obstructive pulmonary disease, unspecified: Secondary | ICD-10-CM | POA: Diagnosis not present

## 2024-01-26 DIAGNOSIS — I82402 Acute embolism and thrombosis of unspecified deep veins of left lower extremity: Secondary | ICD-10-CM | POA: Diagnosis not present

## 2024-01-26 DIAGNOSIS — E785 Hyperlipidemia, unspecified: Secondary | ICD-10-CM | POA: Diagnosis not present

## 2024-01-26 DIAGNOSIS — N184 Chronic kidney disease, stage 4 (severe): Secondary | ICD-10-CM | POA: Diagnosis not present

## 2024-01-26 DIAGNOSIS — R809 Proteinuria, unspecified: Secondary | ICD-10-CM | POA: Diagnosis not present

## 2024-01-26 DIAGNOSIS — Z9889 Other specified postprocedural states: Secondary | ICD-10-CM | POA: Diagnosis not present

## 2024-01-26 DIAGNOSIS — K50918 Crohn's disease, unspecified, with other complication: Secondary | ICD-10-CM | POA: Diagnosis not present

## 2024-01-26 DIAGNOSIS — I10 Essential (primary) hypertension: Secondary | ICD-10-CM | POA: Diagnosis not present

## 2024-01-26 DIAGNOSIS — N200 Calculus of kidney: Secondary | ICD-10-CM | POA: Diagnosis not present

## 2024-01-26 DIAGNOSIS — I2699 Other pulmonary embolism without acute cor pulmonale: Secondary | ICD-10-CM | POA: Diagnosis not present

## 2024-01-26 DIAGNOSIS — Z9049 Acquired absence of other specified parts of digestive tract: Secondary | ICD-10-CM | POA: Diagnosis not present

## 2024-02-26 DIAGNOSIS — Z13220 Encounter for screening for lipoid disorders: Secondary | ICD-10-CM | POA: Diagnosis not present

## 2024-02-26 DIAGNOSIS — I10 Essential (primary) hypertension: Secondary | ICD-10-CM | POA: Diagnosis not present

## 2024-02-26 DIAGNOSIS — Z7901 Long term (current) use of anticoagulants: Secondary | ICD-10-CM | POA: Diagnosis not present

## 2024-02-26 DIAGNOSIS — D849 Immunodeficiency, unspecified: Secondary | ICD-10-CM | POA: Diagnosis not present

## 2024-02-26 DIAGNOSIS — Z86711 Personal history of pulmonary embolism: Secondary | ICD-10-CM | POA: Diagnosis not present

## 2024-03-04 DIAGNOSIS — Z0000 Encounter for general adult medical examination without abnormal findings: Secondary | ICD-10-CM | POA: Diagnosis not present

## 2024-03-04 DIAGNOSIS — J449 Chronic obstructive pulmonary disease, unspecified: Secondary | ICD-10-CM | POA: Diagnosis not present

## 2024-03-04 DIAGNOSIS — I2782 Chronic pulmonary embolism: Secondary | ICD-10-CM | POA: Diagnosis not present

## 2024-03-04 DIAGNOSIS — I10 Essential (primary) hypertension: Secondary | ICD-10-CM | POA: Diagnosis not present

## 2024-03-04 DIAGNOSIS — Z789 Other specified health status: Secondary | ICD-10-CM | POA: Diagnosis not present

## 2024-03-04 DIAGNOSIS — N184 Chronic kidney disease, stage 4 (severe): Secondary | ICD-10-CM | POA: Diagnosis not present

## 2024-03-04 DIAGNOSIS — K50819 Crohn's disease of both small and large intestine with unspecified complications: Secondary | ICD-10-CM | POA: Diagnosis not present

## 2024-03-24 DIAGNOSIS — Z86711 Personal history of pulmonary embolism: Secondary | ICD-10-CM | POA: Diagnosis not present

## 2024-03-24 DIAGNOSIS — Z7901 Long term (current) use of anticoagulants: Secondary | ICD-10-CM | POA: Diagnosis not present

## 2024-04-21 DIAGNOSIS — Z7901 Long term (current) use of anticoagulants: Secondary | ICD-10-CM | POA: Diagnosis not present

## 2024-04-21 DIAGNOSIS — Z86711 Personal history of pulmonary embolism: Secondary | ICD-10-CM | POA: Diagnosis not present

## 2024-05-10 ENCOUNTER — Ambulatory Visit
Admission: RE | Admit: 2024-05-10 | Discharge: 2024-05-10 | Disposition: A | Discharge status: Home / Self Care | Payer: PPO | Source: Ambulatory Visit | Attending: Internal Medicine | Admitting: Internal Medicine

## 2024-05-10 DIAGNOSIS — Z1231 Encounter for screening mammogram for malignant neoplasm of breast: Secondary | ICD-10-CM | POA: Diagnosis not present

## 2024-05-19 DIAGNOSIS — Z86711 Personal history of pulmonary embolism: Secondary | ICD-10-CM | POA: Diagnosis not present

## 2024-05-19 DIAGNOSIS — Z7901 Long term (current) use of anticoagulants: Secondary | ICD-10-CM | POA: Diagnosis not present

## 2024-06-16 DIAGNOSIS — Z7901 Long term (current) use of anticoagulants: Secondary | ICD-10-CM | POA: Diagnosis not present

## 2024-06-16 DIAGNOSIS — Z86711 Personal history of pulmonary embolism: Secondary | ICD-10-CM | POA: Diagnosis not present

## 2024-06-21 DIAGNOSIS — I10 Essential (primary) hypertension: Secondary | ICD-10-CM | POA: Diagnosis not present

## 2024-06-21 DIAGNOSIS — I82402 Acute embolism and thrombosis of unspecified deep veins of left lower extremity: Secondary | ICD-10-CM | POA: Diagnosis not present

## 2024-06-21 DIAGNOSIS — D631 Anemia in chronic kidney disease: Secondary | ICD-10-CM | POA: Diagnosis not present

## 2024-06-21 DIAGNOSIS — I2699 Other pulmonary embolism without acute cor pulmonale: Secondary | ICD-10-CM | POA: Diagnosis not present

## 2024-06-21 DIAGNOSIS — K50918 Crohn's disease, unspecified, with other complication: Secondary | ICD-10-CM | POA: Diagnosis not present

## 2024-06-21 DIAGNOSIS — Z9889 Other specified postprocedural states: Secondary | ICD-10-CM | POA: Diagnosis not present

## 2024-06-21 DIAGNOSIS — N200 Calculus of kidney: Secondary | ICD-10-CM | POA: Diagnosis not present

## 2024-06-21 DIAGNOSIS — R809 Proteinuria, unspecified: Secondary | ICD-10-CM | POA: Diagnosis not present

## 2024-06-21 DIAGNOSIS — Z9049 Acquired absence of other specified parts of digestive tract: Secondary | ICD-10-CM | POA: Diagnosis not present

## 2024-06-21 DIAGNOSIS — J449 Chronic obstructive pulmonary disease, unspecified: Secondary | ICD-10-CM | POA: Diagnosis not present

## 2024-06-21 DIAGNOSIS — E785 Hyperlipidemia, unspecified: Secondary | ICD-10-CM | POA: Diagnosis not present

## 2024-06-21 DIAGNOSIS — N184 Chronic kidney disease, stage 4 (severe): Secondary | ICD-10-CM | POA: Diagnosis not present

## 2024-06-28 DIAGNOSIS — K50918 Crohn's disease, unspecified, with other complication: Secondary | ICD-10-CM | POA: Diagnosis not present

## 2024-06-28 DIAGNOSIS — D631 Anemia in chronic kidney disease: Secondary | ICD-10-CM | POA: Diagnosis not present

## 2024-06-28 DIAGNOSIS — N184 Chronic kidney disease, stage 4 (severe): Secondary | ICD-10-CM | POA: Diagnosis not present

## 2024-06-28 DIAGNOSIS — Z9889 Other specified postprocedural states: Secondary | ICD-10-CM | POA: Diagnosis not present

## 2024-06-28 DIAGNOSIS — E785 Hyperlipidemia, unspecified: Secondary | ICD-10-CM | POA: Diagnosis not present

## 2024-06-28 DIAGNOSIS — N200 Calculus of kidney: Secondary | ICD-10-CM | POA: Diagnosis not present

## 2024-06-28 DIAGNOSIS — I2699 Other pulmonary embolism without acute cor pulmonale: Secondary | ICD-10-CM | POA: Diagnosis not present

## 2024-06-28 DIAGNOSIS — Z9049 Acquired absence of other specified parts of digestive tract: Secondary | ICD-10-CM | POA: Diagnosis not present

## 2024-06-28 DIAGNOSIS — I10 Essential (primary) hypertension: Secondary | ICD-10-CM | POA: Diagnosis not present

## 2024-06-28 DIAGNOSIS — R809 Proteinuria, unspecified: Secondary | ICD-10-CM | POA: Diagnosis not present

## 2024-06-28 DIAGNOSIS — J449 Chronic obstructive pulmonary disease, unspecified: Secondary | ICD-10-CM | POA: Diagnosis not present

## 2024-06-28 DIAGNOSIS — I82402 Acute embolism and thrombosis of unspecified deep veins of left lower extremity: Secondary | ICD-10-CM | POA: Diagnosis not present

## 2024-07-14 DIAGNOSIS — Z7901 Long term (current) use of anticoagulants: Secondary | ICD-10-CM | POA: Diagnosis not present

## 2024-07-14 DIAGNOSIS — Z86711 Personal history of pulmonary embolism: Secondary | ICD-10-CM | POA: Diagnosis not present

## 2024-08-11 DIAGNOSIS — Z86711 Personal history of pulmonary embolism: Secondary | ICD-10-CM | POA: Diagnosis not present

## 2024-08-11 DIAGNOSIS — Z7901 Long term (current) use of anticoagulants: Secondary | ICD-10-CM | POA: Diagnosis not present

## 2024-09-15 DIAGNOSIS — Z86711 Personal history of pulmonary embolism: Secondary | ICD-10-CM | POA: Diagnosis not present

## 2024-09-15 DIAGNOSIS — Z7901 Long term (current) use of anticoagulants: Secondary | ICD-10-CM | POA: Diagnosis not present

## 2024-09-22 DIAGNOSIS — H02831 Dermatochalasis of right upper eyelid: Secondary | ICD-10-CM | POA: Diagnosis not present

## 2024-09-22 DIAGNOSIS — H26491 Other secondary cataract, right eye: Secondary | ICD-10-CM | POA: Diagnosis not present

## 2024-09-22 DIAGNOSIS — H43813 Vitreous degeneration, bilateral: Secondary | ICD-10-CM | POA: Diagnosis not present

## 2024-09-22 DIAGNOSIS — H35362 Drusen (degenerative) of macula, left eye: Secondary | ICD-10-CM | POA: Diagnosis not present

## 2024-09-29 DIAGNOSIS — K5000 Crohn's disease of small intestine without complications: Secondary | ICD-10-CM | POA: Diagnosis not present

## 2024-09-29 DIAGNOSIS — Z86711 Personal history of pulmonary embolism: Secondary | ICD-10-CM | POA: Diagnosis not present

## 2024-09-29 DIAGNOSIS — Z7901 Long term (current) use of anticoagulants: Secondary | ICD-10-CM | POA: Diagnosis not present

## 2024-09-29 NOTE — Progress Notes (Signed)
 Inflammatory Bowel Disease Outpatient Consultation   History of Present Illness  Bridget Huber is a 73 y.o. female with Crohns Continues on aza 50mg  poqd- denies side effects. Denies abdominal pain, dyspepsia, NVD, problems swallowing, bowel habit changes, melena/hematochezia, unplanned loss of weight, and all other GI related complaints.   Declines colonoscopy, does not want to go back on biologic therapy. States she is following with PCP for preventative health maintenance and renal for her CKD. Last gfr 28   INFLAMMATORY BOWEL DISEASE HISTORY:  Type Crohns- fistulizing/stricturing  Age/Date of Diagnosis: 1986 Disease Extent- small intestine/fistulizing Surgeries: 08/20/21- Dr Frederik [facility]) - robotic redo ileocolectomy with anastomosis, entero-enteric fistula takedown small bowel resection 1984  (resection of TI, small piece of cecum, and appendix) with tempoary colostomy (ileocolonic) subsequent reversal (2001) and cholecystectomy (1989 Complications: enterocolomic fistula repaired as above, none other Extra-intestinal Manifestations: none Last Hospitalization associated with IBD:10/ 22  Surgery Last Flare: 9/22  TREATMENT HISTORY: Current tx: self discontinued  Remicade  52m g/kg q8w 5/24. Still taking azathioprine  50 mg daily.- Infliximab  level 9.9 without antibodies 8/22 6-TGN 433  and <156  11/27/22   Prior tx:              Oral 5-ASAs - Pentasa.              Topical 5-ASAs - no.              Oral steroids - prednisone             Budesonide- 2005              Thiopurines - azathioprine .              Biologics - Cimzia  2009-2014/15 (insurance issues). Humira (2008-2009- lasck of response  ENDOSCOPIC HISTORY:  Colonoscopy 03/21/21 nonpatent ileocolonic anastomosis - characterized by  friable mucosa, inflammation, severe stenosis and ulceration. The  anastomosis could not be traversed. A TTS dilator was passed through the  scope. Dilation with an 07-02-09 mm colonic  balloon dilator was performed.  The dilation site was examined following endoscope reinsertion and  showed mild improvement in luminal narrowing. Estimated blood loss was  minimal. Biopsies were taken with a cold forceps for histology. Normal mucosa was found in the entire colon.  Biopsies were taken with a cold forceps for histology: DIAGNOSIS:  A. ILEOCOLONIC ANASTOMOSIS SITE; COLD BIOPSY:  - MARKED CHRONIC ACTIVE INFLAMMATION INVOLVING MUCOSA OF INDETERMINATE  TYPE, WITH ULCERATION.  - NEGATIVE FOR VIRAL CYTOPATHIC EFFECTS AND GRANULOMAS.  - NEGATIVE FOR DYSPLASIA AND MALIGNANCY.   B. RIGHT COLON; COLD BIOPSY:  - COLONIC MUCOSA WITH INTACT CRYPT ARCHITECTURE.  - NEGATIVE FOR ACTIVE INFLAMMATION, COLITIS, DYSPLASIA, AND MALIGNANCY.   C. LEFT COLON; COLD BIOPSY:  - COLONIC MUCOSA WITH INTACT CRYPT ARCHITECTURE.  - NEGATIVE FOR ACTIVE INFLAMMATION, COLITIS, DYSPLASIA, AND MALIGNANCY. SABRA Colonoscopy 05/31/15- RTE- distal ileum inflammation, otherwise normal looking colon.  There was some chronic ileitis without dysplasia/malignancy.5y repeat recommended Colonoscopy1/25/13- RTE- transverse/descending/sigmoid colon/rectal inflammation- biopsies with chronic active inflammation/mild, some ulcerations. EGD 12/19/11- RTE= normal Colonoscopy 07/28/08- RTE- pancolitis, worse on left, moderate to marked inflammation EGD 07/26/08- RTE- normal Colonoscopy 01/01/07- RTE- left sided colitis with active inflammation on biopsies EGD 01/01/07- RTE- normal exam EGD 05/29/04- RTE- normal appearing Colonoscopy 02/26/04- RTE- inflammation in sigmoid colon, inflamed/ulcerated neo-TI. There was active enteritis and colitis of anastomotic and left colon biopsies. Colonoscopy 04/19/99- RTE- inflammatory sigmoid polyp, diverticulitis. Colonoscopy 06/26/1992- RTE- strictured neo-TI otherwise normal appearing Flex sig 06/25/1988-  RTE- normal appearing  IBD HEALTH MAINTENANCE: Vaccines: Immunization History  Administered Date(s)  Administered   COVID-19 Pfizer Monovalent Vaccine 01/07/2020, 01/28/2020, 08/24/2020, 02/05/2021, 09/25/2022   Influenza IIV4, IM PF (3 Yr+) (AFLURIA QUAD) 09/04/2020   Influenza IIV4, cell derived (Egg-Free) PF (6 mo+) (Flucelvax QUAD) 08/31/2018, 08/23/2019, 09/23/2021   Influenza, IM unspecified 09/25/2022     Problem List   Patient Active Problem List  Diagnosis   Cyst, eyelid, right   Health maintenance alteration   CKD (chronic kidney disease), stage IV (CMS/HHS-HCC)   Crohn's disease of small and large intestines with complication (CMS/HHS-HCC)   COPD (chronic obstructive pulmonary disease) (CMS/HHS-HCC)   Hypertension   On total parenteral nutrition (TPN)   pulmonary embolism without acute cor pulmonale (CMS-HCC)     Histories  Past Medical History Past Medical History:  Diagnosis Date   COPD (chronic obstructive pulmonary disease) (CMS/HHS-HCC)    Crohn's disease (CMS/HHS-HCC) diagnosed 1989   small and large intestine   DVT, lower extremity, left (CMS/HHS-HCC) 2005   H/O hypokalemia    H/O mammogram 12/09/2011   History of bone density study 12/13/2013   History of tobacco use    Hypertension    IDA (iron deficiency anemia)    chronic   Inflammatory arthritis    previously treated with methotrexate   Nephrolithiasis    calcium  stones   Osteopenia    a) s/p Actonel, b) Fosamax   Past Surgical History:  Procedure Laterality Date   TONSILLECTOMY  1973   EXPLORATORY LAPAROTOMY  1977   for infertility   CHOLECYSTECTOMY  1989   SIGMOIDOSCOPY FLEXIBLE  06/25/1988   COLONOSCOPY  06/26/1992   Crohn's Disease   COLONOSCOPY  04/19/1999   PH Crohn's Disease   COLOSTOMY  2001   bowel obstruction requiring resection with temporary colostomy - Dr. Claudene   COLONOSCOPY  07/02/2004   PH Crohn's Disease   COLONOSCOPY  01/01/2007   PH Crohn's Disease   COLONOSCOPY  07/28/2008   PH Crohn's Disease   COLONOSCOPY  12/19/2011    Colitis, PH Crohn's Disease: CBF 11/2014; recall ltr mailed 10/23/2014 (dw)   COLONOSCOPY  05/31/2015   PH Crohn's Disease: CBF 05/2020   LAPAROSCOPIC SMALL BOWEL RESECTION & ANASTAMOSIS N/A 08/20/2021   Procedure: **ROBOT** ROBOTIC REDO ILEOCOLECTOMY WITH ANASTOMOSIS, Entero-enteric fistula takedown;  Surgeon: Frederik Mliss Shawnee Isom, MD;  Location[facility];  Service: General Surgery;  Laterality: N/A;   LAPAROSCOPIC LYSIS INTESTINAL ADHESIONS ROBOTIC N/A 08/20/2021   Procedure: ROBOTIC ADHESIOLYSIS;  Surgeon: Frederik Mliss Shawnee Isom, MD;  Location[facility] OR;  Service: General Surgery;  Laterality: N/A;   REPAIR UMBILICAL HERNIA N/A 08/20/2021   Procedure: REPAIR UMBILICAL HERNIA, AGE 72 YEARS OR OLDER; REDUCIBLE;  Surgeon: Frederik Mliss Shawnee Isom, MD;  Location: [facility] OR;  Service: General Surgery;  Laterality: N/A;   EGD  05/29/2004;01/01/2007;07/26/2008;12/19/2011   LITHOTRIPSY     for nephrolithiasis    Allergies Allergies  Allergen Reactions   Nsaids (Non-Steroidal Anti-Inflammatory Drug) Other (See Comments)   Aspirin Abdominal Pain    Also bleeding.    Family History Family History  Problem Relation Age of Onset   Crohn's disease Paternal Aunt      Social History Social History   Socioeconomic History   Marital status: Single  Tobacco Use   Smoking status: Former    Current packs/day: 0.50    Average packs/day: 0.5 packs/day for 32.0 years (16.0 ttl pk-yrs)    Types: Cigarettes  Smokeless tobacco: Never   Tobacco comments:    Quit smoking x 8 years and resumed.   Vaping Use   Vaping status: Never Used  Substance and Sexual Activity   Alcohol  use: No   Drug use: Not Currently   Sexual activity: Defer   Social Drivers of Health   Financial Resource Strain: Low Risk  (03/04/2024)   Overall Financial Resource Strain (CARDIA)    Difficulty of Paying Living Expenses: Not hard at all  Food Insecurity: No Food Insecurity  (03/04/2024)   Hunger Vital Sign    Worried About Running Out of Food in the Last Year: Never true    Ran Out of Food in the Last Year: Never true  Transportation Needs: No Transportation Needs (03/04/2024)   PRAPARE - Administrator, Civil Service (Medical): No    Lack of Transportation (Non-Medical): No  Housing Stability: Unknown (03/24/2024)   Housing Stability Vital Sign    Unable to Pay for Housing in the Last Year: No    Homeless in the Last Year: No      Medications  Home Medications Current Outpatient Medications on File Prior to Visit  Medication Sig Dispense Refill   acetaminophen  (TYLENOL ) 650 MG ER tablet Take 650 mg by mouth every 8 (eight) hours as needed for Pain     azaTHIOprine  (IMURAN ) 50 mg tablet Take 1 tablet by mouth once daily 260 tablet 0   calcium  carbonate-vitamin D3 (CALTRATE 600+D) 600 mg-10 mcg (400 unit) tablet Take 1 tablet by mouth once daily     cholecalciferol  (VITAMIN D3) 1000 unit tablet Take 2,000 Units by mouth once daily     cyanocobalamin  (VITAMIN B12) 1000 MCG tablet Take 1,000 mcg by mouth once daily     ferrous sulfate  325 (65 FE) MG tablet Take 325 mg by mouth daily with breakfast     multivit/folic acid /vit K1 (ONE-A-DAY WOMEN'S 50 PLUS ORAL) Take 1 tablet by mouth once daily     omeprazole (PRILOSEC) 20 MG DR capsule Take 20 mg by mouth once daily     warfarin (COUMADIN) 5 MG tablet TAKE ONE TABLET BY MOUTH ON MONDAYS, WEDNESDAYS, THURSDAYS AND FRIDAYS.  TAKE ONE-HALF TABLET ON TUESDAYS, SATURDAYS AND SUNDAYS. 90 tablet 0   No current facility-administered medications on file prior to visit.    Review of Systems  A ROS was performed including pertinent positive and negative as documented. All other systems are negative.    Physical Examination  BP 133/77   Pulse 91   Temp 36.6 C (97.9 F) (Oral)   Ht 157 cm (5' 1.81)   Wt 77.8 kg (171 lb 9.6 oz)   BMI 31.58 kg/m  GEN: NAD, appears stated age, doesn't  appear chronically ill PSYCH: Cooperative, without pressured speech EYE: EOMI, conjunctivae pink, sclerae anicteric NECK: Supple CV: RR without R/Gs  RESP: CTAB posteriorly, without wheezing GI: NABS, soft, NT/ND, without rebound or guarding, no HSM appreciated MSK/EXT: No edema SKIN: No concerning rashes/lesions, no EN/PG   Review of Data  I reviewed the following data at the time of this encounter:  Laboratory Studies   Nurse Only on 09/29/2024  Component Date Value   POC Prothrombin Time WB 09/29/2024 37.0 (H)    POC Prothrombin INR WB 09/29/2024 3.1 (H)   Nurse Only on 09/15/2024  Component Date Value   POC Prothrombin Time WB 09/15/2024 37.9 (H)    POC Prothrombin INR WB 09/15/2024 3.2 (H)    INR - External  09/15/2024 3.2 (!)   Nurse Only on 08/11/2024  Component Date Value   POC Prothrombin Time WB 08/11/2024 25.0 (H)    POC Prothrombin INR WB 08/11/2024 2.1 (H)    INR - External 08/11/2024 2.1 (!)   Nurse Only on 07/14/2024  Component Date Value   POC Prothrombin Time WB 07/14/2024 28.1 (H)    POC Prothrombin INR WB 07/14/2024 2.3 (H)    INR - External 07/14/2024 2.3 (!)   Nurse Only on 06/16/2024  Component Date Value   POC Prothrombin Time WB 06/16/2024 29.7 (H)    POC Prothrombin INR WB 06/16/2024 2.5 (H)    INR - External 06/16/2024 2.5 (!)   Nurse Only on 05/19/2024  Component Date Value   POC Prothrombin Time WB 05/19/2024 26.9 (H)    POC Prothrombin INR WB 05/19/2024 2.2 (H)    INR - External 05/19/2024 2.2 (!)   Nurse Only on 04/21/2024  Component Date Value   POC Prothrombin Time WB 04/21/2024 27.2 (H)    POC Prothrombin INR WB 04/21/2024 2.3 (H)    INR - External 04/21/2024 2.3 (!)      Calprotectin, Fecal - [facility]  Date Value Ref Range Status  10/06/2023 277 (H) 0 - 120 ug/g Final    Comment:    Concentration     Interpretation   Follow-Up < 5 - 50 ug/g     Normal           None >50 -120 ug/g     Borderline        Re-evaluate in 4-6 weeks     >120 ug/g     Abnormal         Repeat as clinically                                    indicated    Calprotectin, Fecal - [facility]  Date Value Ref Range Status  10/06/2023 277 (H) 0 - 120 ug/g Final    Comment:    Concentration     Interpretation   Follow-Up < 5 - 50 ug/g     Normal           None >50 -120 ug/g     Borderline       Re-evaluate in 4-6 weeks     >120 ug/g     Abnormal         Repeat as clinically                                    indicated     Quant Gold:  QuantiFERON Nil Value - [facility]  Date Value Ref Range Status  11/19/2021 0.06 IU/mL Final       Imaging Studies   Last CT a/p 10/10/21- Impression: 1.  Near complete resolution of previously seen air and fluid containing collection in the right upper quadrant. Small persistent focus of air and fluid along the anterior-inferior margin of the liver and possible fistulous tract extending from the bowel anastomosis to the drain site. 2.  New pneumobilia of uncertain etiology  Assessment     Bridget Huber is a 73 y.o. female with fistulizing Crohns- s/p surgical interventions as above. Declines further biologic therapy. Denies gi concerns. Cbc, bmp, liver panel, esr, crp today. Reviewed renal dosing for the AZA- may need to  decrease to 75% of dose. She declines colonoscopy/flex sig. I would like her to follow up in 58m but she says she will return in a year.      CHRISTIANE  CHEYENNE GAILS, NP

## 2024-12-16 ENCOUNTER — Emergency Department

## 2024-12-16 ENCOUNTER — Other Ambulatory Visit: Payer: Self-pay

## 2024-12-16 ENCOUNTER — Emergency Department
Admission: EM | Admit: 2024-12-16 | Discharge: 2024-12-16 | Disposition: A | Discharge status: Home / Self Care | Attending: Emergency Medicine | Admitting: Emergency Medicine

## 2024-12-16 DIAGNOSIS — N189 Chronic kidney disease, unspecified: Secondary | ICD-10-CM | POA: Diagnosis not present

## 2024-12-16 DIAGNOSIS — R1031 Right lower quadrant pain: Secondary | ICD-10-CM | POA: Diagnosis present

## 2024-12-16 DIAGNOSIS — N3090 Cystitis, unspecified without hematuria: Secondary | ICD-10-CM | POA: Diagnosis not present

## 2024-12-16 DIAGNOSIS — I129 Hypertensive chronic kidney disease with stage 1 through stage 4 chronic kidney disease, or unspecified chronic kidney disease: Secondary | ICD-10-CM | POA: Insufficient documentation

## 2024-12-16 DIAGNOSIS — K5090 Crohn's disease, unspecified, without complications: Secondary | ICD-10-CM | POA: Diagnosis not present

## 2024-12-16 DIAGNOSIS — J449 Chronic obstructive pulmonary disease, unspecified: Secondary | ICD-10-CM | POA: Insufficient documentation

## 2024-12-16 LAB — CBC
HCT: 38 % (ref 36.0–46.0)
Hemoglobin: 12.5 g/dL (ref 12.0–15.0)
MCH: 29.8 pg (ref 26.0–34.0)
MCHC: 32.9 g/dL (ref 30.0–36.0)
MCV: 90.5 fL (ref 80.0–100.0)
Platelets: 318 K/uL (ref 150–400)
RBC: 4.2 MIL/uL (ref 3.87–5.11)
RDW: 14.4 % (ref 11.5–15.5)
WBC: 9.9 K/uL (ref 4.0–10.5)
nRBC: 0.2 % (ref 0.0–0.2)

## 2024-12-16 LAB — URINALYSIS, ROUTINE W REFLEX MICROSCOPIC
Bilirubin Urine: NEGATIVE
Glucose, UA: NEGATIVE mg/dL
Hgb urine dipstick: NEGATIVE
Ketones, ur: NEGATIVE mg/dL
Nitrite: NEGATIVE
Protein, ur: 30 mg/dL — AB
Specific Gravity, Urine: 1.017 (ref 1.005–1.030)
Squamous Epithelial / HPF: 0 /HPF (ref 0–5)
WBC, UA: >50 WBC/hpf (ref 0–5)
pH: 6 (ref 5.0–8.0)

## 2024-12-16 LAB — COMPREHENSIVE METABOLIC PANEL WITH GFR
ALT: 33 U/L (ref 0–44)
AST: 26 U/L (ref 15–41)
Albumin: 3.6 g/dL (ref 3.5–5.0)
Alkaline Phosphatase: 90 U/L (ref 38–126)
Anion gap: 18 — ABNORMAL HIGH (ref 5–15)
BUN: 34 mg/dL — ABNORMAL HIGH (ref 8–23)
CO2: 15 mmol/L — ABNORMAL LOW (ref 22–32)
Calcium: 7.4 mg/dL — ABNORMAL LOW (ref 8.9–10.3)
Chloride: 110 mmol/L (ref 98–111)
Creatinine, Ser: 2.68 mg/dL — ABNORMAL HIGH (ref 0.44–1.00)
GFR, Estimated: 18 mL/min — ABNORMAL LOW
Glucose, Bld: 94 mg/dL (ref 70–99)
Potassium: 3.2 mmol/L — ABNORMAL LOW (ref 3.5–5.1)
Sodium: 142 mmol/L (ref 135–145)
Total Bilirubin: 0.4 mg/dL (ref 0.0–1.2)
Total Protein: 7.1 g/dL (ref 6.5–8.1)

## 2024-12-16 LAB — TYPE AND SCREEN
ABO/RH(D): O POS
Antibody Screen: NEGATIVE

## 2024-12-16 LAB — PROTIME-INR
INR: 3.5 — ABNORMAL HIGH (ref 0.8–1.2)
Prothrombin Time: 36.6 s — ABNORMAL HIGH (ref 11.4–15.2)

## 2024-12-16 LAB — LIPASE, BLOOD: Lipase: 90 U/L — ABNORMAL HIGH (ref 11–51)

## 2024-12-16 MED ORDER — CEPHALEXIN 500 MG PO CAPS: 500.0000 mg | ORAL_CAPSULE | Freq: Two times a day (BID) | ORAL | 0 refills | Status: AC

## 2024-12-16 MED ORDER — SODIUM CHLORIDE 0.9 % IV BOLUS
1000.0000 mL | Freq: Once | INTRAVENOUS | Status: AC
  Administered 2024-12-16: 1000 mL via INTRAVENOUS

## 2024-12-16 MED ORDER — PREDNISONE 20 MG PO TABS: 40.0000 mg | ORAL_TABLET | Freq: Every day | ORAL | 0 refills | Status: AC

## 2024-12-16 MED ORDER — CEPHALEXIN 500 MG PO CAPS
500.0000 mg | ORAL_CAPSULE | Freq: Once | ORAL | Status: AC
  Administered 2024-12-16: 500 mg via ORAL
  Filled 2024-12-16: qty 1
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
## 2024-12-16 NOTE — ED Notes (Signed)
 Bed alarm on, fall risk bracelet on, and non slip shoes are on

## 2024-12-16 NOTE — ED Triage Notes (Signed)
 Pt to ED for bloody diarrhea and abdominal pain with 30# weight loss since 3 days ago (states weighed 157# on Monday, weighed 120# this AM) with hx severe Crohns. Hx 3 colon resections.  Pain comes and goes, no pain at this moment. Denies weakness. 5 episodes of diarrhea today.

## 2024-12-17 LAB — URINE CULTURE: Culture: <10000 — AB

## 2024-12-27 ENCOUNTER — Other Ambulatory Visit: Payer: Self-pay | Admitting: Internal Medicine

## 2024-12-27 DIAGNOSIS — Z1231 Encounter for screening mammogram for malignant neoplasm of breast: Secondary | ICD-10-CM

## 2025-05-11 ENCOUNTER — Encounter
